# Patient Record
Sex: Male | Born: 1935 | Race: White | Hispanic: No | Marital: Married | Smoking: Former smoker
Health system: Southern US, Community
[De-identification: ages and names within clinical notes are randomized; demographics above are authoritative.]

## PROBLEM LIST (undated history)

## (undated) DIAGNOSIS — F0390 Unspecified dementia without behavioral disturbance: Secondary | ICD-10-CM

## (undated) DIAGNOSIS — K219 Gastro-esophageal reflux disease without esophagitis: Secondary | ICD-10-CM

---

## 2008-11-24 ENCOUNTER — Ambulatory Visit: Payer: Self-pay | Admitting: Gastroenterology

## 2008-12-12 ENCOUNTER — Ambulatory Visit: Payer: Self-pay | Admitting: Gastroenterology

## 2009-10-03 ENCOUNTER — Ambulatory Visit: Payer: Self-pay | Admitting: Otolaryngology

## 2009-10-30 ENCOUNTER — Ambulatory Visit: Payer: Self-pay | Admitting: Otolaryngology

## 2009-11-01 ENCOUNTER — Ambulatory Visit: Payer: Self-pay | Admitting: Otolaryngology

## 2009-11-06 LAB — PATHOLOGY REPORT

## 2012-11-22 ENCOUNTER — Ambulatory Visit: Payer: Self-pay | Admitting: Neurology

## 2014-09-13 ENCOUNTER — Other Ambulatory Visit: Payer: Self-pay | Admitting: Internal Medicine

## 2014-09-13 ENCOUNTER — Ambulatory Visit
Admission: RE | Admit: 2014-09-13 | Discharge: 2014-09-13 | Disposition: A | Discharge status: Home / Self Care | Payer: Medicare Other | Source: Ambulatory Visit | Attending: Internal Medicine | Admitting: Internal Medicine

## 2014-09-13 DIAGNOSIS — I2584 Coronary atherosclerosis due to calcified coronary lesion: Secondary | ICD-10-CM | POA: Diagnosis not present

## 2014-09-13 DIAGNOSIS — I2510 Atherosclerotic heart disease of native coronary artery without angina pectoris: Secondary | ICD-10-CM | POA: Diagnosis not present

## 2014-09-13 DIAGNOSIS — R0602 Shortness of breath: Secondary | ICD-10-CM | POA: Diagnosis present

## 2014-09-13 DIAGNOSIS — J984 Other disorders of lung: Secondary | ICD-10-CM | POA: Insufficient documentation

## 2014-09-13 MED ORDER — IOHEXOL 350 MG/ML SOLN
75.0000 mL | Freq: Once | INTRAVENOUS | Status: AC | PRN
  Administered 2014-09-13: 75 mL via INTRAVENOUS

## 2016-03-08 ENCOUNTER — Inpatient Hospital Stay
Admission: EM | Admit: 2016-03-08 | Discharge: 2016-03-12 | DRG: 379 | Disposition: A | Discharge status: Home / Self Care | Payer: Medicare Other | Attending: Internal Medicine | Admitting: Internal Medicine

## 2016-03-08 ENCOUNTER — Emergency Department: Payer: Medicare Other

## 2016-03-08 DIAGNOSIS — Z7982 Long term (current) use of aspirin: Secondary | ICD-10-CM | POA: Diagnosis not present

## 2016-03-08 DIAGNOSIS — K5641 Fecal impaction: Secondary | ICD-10-CM | POA: Diagnosis present

## 2016-03-08 DIAGNOSIS — N189 Chronic kidney disease, unspecified: Secondary | ICD-10-CM | POA: Diagnosis present

## 2016-03-08 DIAGNOSIS — Z833 Family history of diabetes mellitus: Secondary | ICD-10-CM | POA: Diagnosis not present

## 2016-03-08 DIAGNOSIS — E876 Hypokalemia: Secondary | ICD-10-CM | POA: Diagnosis present

## 2016-03-08 DIAGNOSIS — K8020 Calculus of gallbladder without cholecystitis without obstruction: Secondary | ICD-10-CM

## 2016-03-08 DIAGNOSIS — K449 Diaphragmatic hernia without obstruction or gangrene: Secondary | ICD-10-CM | POA: Diagnosis present

## 2016-03-08 DIAGNOSIS — R945 Abnormal results of liver function studies: Secondary | ICD-10-CM

## 2016-03-08 DIAGNOSIS — E1122 Type 2 diabetes mellitus with diabetic chronic kidney disease: Secondary | ICD-10-CM | POA: Diagnosis present

## 2016-03-08 DIAGNOSIS — F0280 Dementia in other diseases classified elsewhere without behavioral disturbance: Secondary | ICD-10-CM | POA: Diagnosis present

## 2016-03-08 DIAGNOSIS — I129 Hypertensive chronic kidney disease with stage 1 through stage 4 chronic kidney disease, or unspecified chronic kidney disease: Secondary | ICD-10-CM | POA: Diagnosis present

## 2016-03-08 DIAGNOSIS — K920 Hematemesis: Secondary | ICD-10-CM | POA: Diagnosis present

## 2016-03-08 DIAGNOSIS — Z66 Do not resuscitate: Secondary | ICD-10-CM | POA: Diagnosis present

## 2016-03-08 DIAGNOSIS — K219 Gastro-esophageal reflux disease without esophagitis: Secondary | ICD-10-CM | POA: Diagnosis present

## 2016-03-08 DIAGNOSIS — R7989 Other specified abnormal findings of blood chemistry: Secondary | ICD-10-CM

## 2016-03-08 DIAGNOSIS — K31811 Angiodysplasia of stomach and duodenum with bleeding: Principal | ICD-10-CM | POA: Diagnosis present

## 2016-03-08 DIAGNOSIS — K2901 Acute gastritis with bleeding: Secondary | ICD-10-CM

## 2016-03-08 DIAGNOSIS — R112 Nausea with vomiting, unspecified: Secondary | ICD-10-CM | POA: Diagnosis present

## 2016-03-08 DIAGNOSIS — R8290 Unspecified abnormal findings in urine: Secondary | ICD-10-CM | POA: Diagnosis not present

## 2016-03-08 HISTORY — DX: Gastro-esophageal reflux disease without esophagitis: K21.9

## 2016-03-08 HISTORY — DX: Unspecified dementia, unspecified severity, without behavioral disturbance, psychotic disturbance, mood disturbance, and anxiety: F03.90

## 2016-03-08 LAB — CBC WITH DIFFERENTIAL/PLATELET
Basophils Absolute: 0 K/uL (ref 0–0.1)
Basophils Relative: 0 %
Eosinophils Absolute: 0 K/uL (ref 0–0.7)
Eosinophils Relative: 0 %
HCT: 43.6 % (ref 40.0–52.0)
Hemoglobin: 15.2 g/dL (ref 13.0–18.0)
Lymphocytes Relative: 18 %
Lymphs Abs: 1.9 K/uL (ref 1.0–3.6)
MCH: 32.2 pg (ref 26.0–34.0)
MCHC: 34.8 g/dL (ref 32.0–36.0)
MCV: 92.7 fL (ref 80.0–100.0)
Monocytes Absolute: 0.7 K/uL (ref 0.2–1.0)
Monocytes Relative: 6 %
Neutro Abs: 8.3 K/uL — ABNORMAL HIGH (ref 1.4–6.5)
Neutrophils Relative %: 76 %
Platelets: 169 K/uL (ref 150–440)
RBC: 4.71 MIL/uL (ref 4.40–5.90)
RDW: 13.7 % (ref 11.5–14.5)
WBC: 10.9 K/uL — ABNORMAL HIGH (ref 3.8–10.6)

## 2016-03-08 LAB — COMPREHENSIVE METABOLIC PANEL WITH GFR
ALT: 206 U/L — ABNORMAL HIGH (ref 17–63)
AST: 221 U/L — ABNORMAL HIGH (ref 15–41)
Albumin: 3.6 g/dL (ref 3.5–5.0)
Alkaline Phosphatase: 129 U/L — ABNORMAL HIGH (ref 38–126)
Anion gap: 8 (ref 5–15)
BUN: 25 mg/dL — ABNORMAL HIGH (ref 6–20)
CO2: 25 mmol/L (ref 22–32)
Calcium: 8.8 mg/dL — ABNORMAL LOW (ref 8.9–10.3)
Chloride: 104 mmol/L (ref 101–111)
Creatinine, Ser: 0.7 mg/dL (ref 0.61–1.24)
GFR calc Af Amer: >60 mL/min
GFR calc non Af Amer: >60 mL/min
Glucose, Bld: 117 mg/dL — ABNORMAL HIGH (ref 65–99)
Potassium: 3.4 mmol/L — ABNORMAL LOW (ref 3.5–5.1)
Sodium: 137 mmol/L (ref 135–145)
Total Bilirubin: 5.1 mg/dL — ABNORMAL HIGH (ref 0.3–1.2)
Total Protein: 7.2 g/dL (ref 6.5–8.1)

## 2016-03-08 LAB — GLUCOSE, CAPILLARY
GLUCOSE-CAPILLARY: 118 mg/dL — AB (ref 65–99)
Glucose-Capillary: 116 mg/dL — ABNORMAL HIGH (ref 65–99)

## 2016-03-08 LAB — HEMOGLOBIN AND HEMATOCRIT, BLOOD
HCT: 44.1 % (ref 40.0–52.0)
Hemoglobin: 15.2 g/dL (ref 13.0–18.0)

## 2016-03-08 LAB — LIPASE, BLOOD: Lipase: 20 U/L (ref 11–51)

## 2016-03-08 LAB — TYPE AND SCREEN
ABO/RH(D): O POS
Antibody Screen: NEGATIVE

## 2016-03-08 MED ORDER — INSULIN ASPART 100 UNIT/ML ~~LOC~~ SOLN
0.0000 [IU] | Freq: Every day | SUBCUTANEOUS | Status: DC
Start: 2016-03-08 — End: 2016-03-09

## 2016-03-08 MED ORDER — SODIUM CHLORIDE 0.9 % IV SOLN
80.0000 mg | Freq: Once | INTRAVENOUS | Status: AC
  Administered 2016-03-08: 80 mg via INTRAVENOUS
  Filled 2016-03-08: qty 80

## 2016-03-08 MED ORDER — SODIUM CHLORIDE 0.9 % IV SOLN
8.0000 mg/h | INTRAVENOUS | Status: AC
  Administered 2016-03-08 – 2016-03-11 (×6): 8 mg/h via INTRAVENOUS
  Filled 2016-03-08 (×6): qty 80

## 2016-03-08 MED ORDER — ACETAMINOPHEN 650 MG RE SUPP: 650.0000 mg | Freq: Four times a day (QID) | RECTAL | Status: DC | PRN

## 2016-03-08 MED ORDER — ACETAMINOPHEN 325 MG PO TABS
650.0000 mg | ORAL_TABLET | Freq: Four times a day (QID) | ORAL | Status: DC | PRN
  Administered 2016-03-09 – 2016-03-10 (×2): 650 mg via ORAL
  Filled 2016-03-08 (×2): qty 2

## 2016-03-08 MED ORDER — ONDANSETRON HCL 4 MG PO TABS: 4.0000 mg | ORAL_TABLET | Freq: Four times a day (QID) | ORAL | Status: DC | PRN

## 2016-03-08 MED ORDER — ONDANSETRON HCL 4 MG/2ML IJ SOLN: 4.0000 mg | Freq: Four times a day (QID) | INTRAMUSCULAR | Status: DC | PRN

## 2016-03-08 MED ORDER — IOPAMIDOL (ISOVUE-300) INJECTION 61%: 15.0000 mL | INTRAVENOUS | Status: AC

## 2016-03-08 MED ORDER — MORPHINE SULFATE (PF) 4 MG/ML IV SOLN: 2.0000 mg | INTRAVENOUS | Status: DC | PRN

## 2016-03-08 MED ORDER — PANTOPRAZOLE SODIUM 40 MG IV SOLR
40.0000 mg | Freq: Two times a day (BID) | INTRAVENOUS | Status: DC
  Administered 2016-03-12: 09:00:00 40 mg via INTRAVENOUS
  Filled 2016-03-08: qty 40

## 2016-03-08 MED ORDER — HYDROMORPHONE HCL 1 MG/ML IJ SOLN
0.5000 mg | Freq: Once | INTRAMUSCULAR | Status: AC
  Administered 2016-03-08: 0.5 mg via INTRAVENOUS
  Filled 2016-03-08: qty 1

## 2016-03-08 MED ORDER — MEMANTINE HCL 10 MG PO TABS
10.0000 mg | ORAL_TABLET | Freq: Every day | ORAL | Status: DC
  Administered 2016-03-09 – 2016-03-12 (×4): 10 mg via ORAL
  Filled 2016-03-08 (×2): qty 1
  Filled 2016-03-08: qty 2
  Filled 2016-03-08 (×3): qty 1

## 2016-03-08 MED ORDER — ONDANSETRON HCL 4 MG/2ML IJ SOLN
4.0000 mg | Freq: Once | INTRAMUSCULAR | Status: AC
  Administered 2016-03-08: 4 mg via INTRAVENOUS
  Filled 2016-03-08: qty 2

## 2016-03-08 MED ORDER — POTASSIUM CHLORIDE IN NACL 20-0.9 MEQ/L-% IV SOLN
INTRAVENOUS | Status: AC
  Administered 2016-03-08: 20:00:00 via INTRAVENOUS
  Filled 2016-03-08: qty 1000

## 2016-03-08 MED ORDER — PANTOPRAZOLE SODIUM 40 MG IV SOLR
40.0000 mg | Freq: Once | INTRAVENOUS | Status: AC
  Administered 2016-03-08: 40 mg via INTRAVENOUS
  Filled 2016-03-08: qty 40

## 2016-03-08 MED ORDER — INSULIN ASPART 100 UNIT/ML ~~LOC~~ SOLN: 0.0000 [IU] | Freq: Three times a day (TID) | SUBCUTANEOUS | Status: DC

## 2016-03-08 NOTE — ED Notes (Signed)
Pt given blankets. Family at bedside.

## 2016-03-08 NOTE — H&P (Signed)
[facility] - [place] at [facility]   PATIENT NAME: Joshua Vaughn    MR#:  [id number]  DATE OF BIRTH:  [birth year]  DATE OF ADMISSION:  03/08/2016  PRIMARY CARE PHYSICIAN: Danella Penton, MD   REQUESTING/REFERRING PHYSICIAN: Huel Cote  CHIEF COMPLAINT:   Coffee-ground emesis HISTORY OF PRESENT ILLNESS:  Joshua Vaughn  is a 82 y.o. male with a known history of Dementia, GERD, diabetes mellitus and hypertension is brought in by EMS from home for coffee-ground emesis. According to the daughter patient was having epigastric abdominal pain, wife was reporting that he had similar episode one week ago which spontaneously resolved. Patient is a poor historian as he has dementia. CT abdomen has revealed fecal impaction and urinary bladder with high-density material. Hemoglobin is at 15, LFTs are elevated  PAST MEDICAL HISTORY:  Diabetes mellitus, hypertension, sickle cell disease, chronic kidney disease  PAST SURGICAL HISTOIRY:  History reviewed. No pertinent surgical history.  SOCIAL HISTORY:   Social History  Substance Use Topics  . Smoking status: Never Smoker  . Smokeless tobacco: Never Used  . Alcohol use No    FAMILY HISTORY:  Diabetes mellitus onset his family according to the family  DRUG ALLERGIES:  No Known Allergies  REVIEW OF SYSTEMS:  Review of systems unobtainable as the patient is demented  MEDICATIONS AT HOME:   Prior to Admission medications   Medication Sig Start Date End Date Taking? Authorizing Provider  aspirin EC 81 MG tablet Take by mouth.   Yes Historical Provider, MD  docusate sodium (COLACE) 100 MG capsule Take 100 mg by mouth daily.   Yes Historical Provider, MD  memantine (NAMENDA) 10 MG tablet 10 mg 2 (two) times daily.  03/02/16  Yes Historical Provider, MD      VITAL SIGNS:  Blood pressure 112/69, pulse 91, temperature 97.8 F (36.6 C), temperature source Axillary, resp. rate 18, SpO2 96 %.  PHYSICAL EXAMINATION:   GENERAL:  82 y.o.-year-old patient lying in the bed with no acute distress.  EYES: Pupils equal, round, reactive to light and accommodation. No scleral icterus.  HEENT: Head atraumatic, normocephalic. Oropharynx and nasopharynx clear.  NECK:  Supple, no jugular venous distention. No thyroid enlargement, no tenderness.  LUNGS: Normal breath sounds bilaterally, no wheezing, rales,rhonchi or crepitation. No use of accessory muscles of respiration.  CARDIOVASCULAR: S1, S2 normal. No murmurs, rubs, or gallops.  ABDOMEN: Soft, nontender, nondistended. Bowel sounds present. No organomegaly or mass.  EXTREMITIES: No pedal edema, cyanosis, or clubbing.  NEUROLOGIC: Patient is demented Gait not checked.  PSYCHIATRIC: The patient is demented SKIN: No obvious rash, lesion, or ulcer.   LABORATORY PANEL:   CBC  Recent Labs Lab 03/08/16 1420  WBC 10.9*  HGB 15.2  HCT 43.6  PLT 169   ------------------------------------------------------------------------------------------------------------------  Chemistries   Recent Labs Lab 03/08/16 1420  NA 137  K 3.4*  CL 104  CO2 25  GLUCOSE 117*  BUN 25*  CREATININE 0.70  CALCIUM 8.8*  AST 221*  ALT 206*  ALKPHOS 129*  BILITOT 5.1*   ------------------------------------------------------------------------------------------------------------------  Cardiac Enzymes No results for input(s): TROPONINI in the last 168 hours. ------------------------------------------------------------------------------------------------------------------  RADIOLOGY:  Ct Abdomen Pelvis Wo Contrast  Result Date: 03/08/2016 CLINICAL DATA:  Abdominal distension, no bowel movement for 4 days EXAM: CT ABDOMEN AND PELVIS WITHOUT CONTRAST TECHNIQUE: Multidetector CT imaging of the abdomen and pelvis was performed following the standard protocol without IV contrast. COMPARISON:  None. FINDINGS: Lower chest: Lung bases shows mild posterior  atelectasis. Extensive  atherosclerotic calcifications of coronary arteries. Borderline cardiomegaly. Small hiatal hernia. Hepatobiliary: Unenhanced liver shows no biliary ductal dilatation. Gallbladder is contracted. Small calcified gallstones are noted within gallbladder the largest measures 5 mm. Pancreas: Unenhanced pancreas is normal. Spleen: Unenhanced spleen is normal. Adrenals/Urinary Tract: No adrenal gland mass. No nephrolithiasis. No hydronephrosis or hydroureter. No calcified ureteral calculi. The urinary bladder content measures 30 Hounsfield in attenuation. This may be due to proteinaceous or hemorrhagic products. Clinical correlation is necessary. Stomach/Bowel: No small bowel obstruction. No pericecal inflammation. The terminal ileum is unremarkable. Normal appendix partially visualized in axial image 58. No evidence of acute colitis. Moderate gas noted in transverse colon. There is abundant stool within rectum. The rectum measures at least 8.8 cm in diameter consistent with fecal impaction. Moderate stool noted in distal sigmoid colon. Vascular/Lymphatic: No aortic aneurysm.  No adenopathy. Reproductive: Prostate gland and seminal vesicles are unremarkable. The urinary bladder is unremarkable. Other: No ascites or free abdominal air. Bilateral inguinal scrotal canal hernia containing fat without evidence of acute complication. Musculoskeletal: Sagittal images of the spine shows diffuse osteopenia. Mild degenerative changes thoracolumbar spine. Schmorl's node deformity noted upper endplate of L2 and L3 vertebral body. Minimal compression deformity upper endplate of L2 vertebral body. Schmorl's node deformity upper endplate of L4 vertebral body. There is disc space flattening with vacuum disc phenomenon at L5-S1 level. IMPRESSION: 1. There is abundant stool within rectum. The rectum is distended with stool measures at least 8.8 cm in diameter consistent with significant fecal impaction. Moderate stool noted in distal sigmoid  colon. 2. High-density material within urinary bladder may represent proteinaceous or hemorrhagic products. Clinical correlation is necessary. No hydronephrosis or hydroureter. No nephrolithiasis. 3. No pericecal inflammation. Normal appendix partially visualized. Moderate gas noted within transverse colon. 4. No small bowel obstruction. 5. Extensive atherosclerotic calcifications of coronary arteries. 6. Degenerative changes lumbar spine as described above. Electronically Signed   By: Natasha MeadLiviu  Pop M.D.   On: 03/08/2016 15:26    EKG:  No orders found for this or any previous visit.  IMPRESSION AND PLAN:   Joshua MeadJoe Vaughn  is a 82 y.o. male with a known history of Dementia, GERD, diabetes mellitus and hypertension is brought in by EMS from home for coffee-ground emesis. According to the daughter patient was having epigastric abdominal pain  #Upper GI bleed with coffee-ground emesis Admit to MedSurg unit, patient is hemodynamically stable at this time with hemoglobin at 15 Nothing by mouth, IV fluids, GI consult Protonix drip Monitor hemoglobin and hematocrit every 6 hours and transfuse as needed Weakly positive fecal occult blood according to the ED physician  #Transaminitis-unclear etiology Check hepatitis panel, GI consult and IV fluids  #Chronic dementia continued  Namenda  #Diabetes mellitus currently patient is nothing by mouth we will provide sliding scale insulin  #Urinary bladder with dense proteinaceous versus hemorrhagic products Monitor hemoglobin and hematocrit and urology consult is placed    All the records are reviewed and case discussed with ED provider. Management plans discussed with the patient, family and they are in agreement.  CODE STATUS: DO NOT RESUSCITATE, wife healthcare power of attorney  TOTAL TIME TAKING CARE OF THIS PATIENT: 45 minutes.   Note: This dictation was prepared with Dragon dictation along with smaller phrase technology. Any transcriptional errors  that result from this process are unintentional.  Ramonita LabGouru, Akio Hudnall M.D on 03/08/2016 at 4:48 PM  Between 7am to 6pm - Pager - [phone]  After 6pm go to [email or web address] -  password [email or web address] [facility]  [facility]  Office  [phone]  CC: Primary care physician; Danella PentonMark F Miller, MD

## 2016-03-08 NOTE — Progress Notes (Signed)
Dr. Mena GoesEskridge notified about Urology consult. Verbal order to bladder scan patient, if greater than 300ml, place 1816fr foley catheter, and send urinalysis. [facility] who will be taking over patient care.

## 2016-03-08 NOTE — ED Provider Notes (Addendum)
Time Seen: Approximately *1450  I have reviewed the triage notes  Chief Complaint: Emesis   History of Present Illness: Joshua Vaughn is a 82 y.o. male *who presents via EMS from home with vomiting. EMS providers shows me a picture of the emesis which is charcoal in appearance with coffee ground emesis. Red blood was seen. Patient was transported here uneventfully and I have not had a chance to interview his family. The patient has a known history of dementia is unable to offer any history or review of systems. EMS notes the patient had some diffuse abdominal pain.   History reviewed. No pertinent past medical history.  There are no active problems to display for this patient.   History reviewed. No pertinent surgical history.  History reviewed. No pertinent surgical history.    Allergies:  Patient has no known allergies.  Family History: No family history on file.  Social History: Social History  Substance Use Topics  . Smoking status: Never Smoker  . Smokeless tobacco: Never Used  . Alcohol use No     Review of Systems:   10 point review of systems was performed and was otherwise negative: Review of systems through EMS and medical record Constitutional: No fever Eyes: No visual disturbances ENT: No sore throat, ear pain Cardiac: No chest pain Respiratory: No shortness of breath, wheezing, or stridor Abdomen: Diffuse abdominal pain with a one time episode of emesis at home. No diarrhea Endocrine: No weight loss, No night sweats Extremities: No peripheral edema, cyanosis Skin: No rashes, easy bruising Neurologic: No focal weakness, trouble with speech or swollowing Urologic: No dysuria, Hematuria, or urinary frequency *  Physical Exam:  ED Triage Vitals [03/08/16 1419]  Enc Vitals Group     BP (!) 141/76     Pulse Rate 100     Resp 18     Temp 97.8 F (36.6 C)     Temp Source Axillary     SpO2 94 %     Weight      Height      Head Circumference       Peak Flow      Pain Score      Pain Loc      Pain Edu?      Excl. in GC?     General: Awake , Alert , and Oriented times1 and a phasic occasionally follows commands Head: Normal cephalic , atraumatic Eyes: Pupils equal , round, reactive to light Nose/Throat: No nasal drainage, patent upper airway without erythema or exudate. Dry mucous membranes Neck: Supple, Full range of motion, No anterior adenopathy or palpable thyroid masses Lungs: Clear to ascultation without wheezes , rhonchi, or rales Heart: Regular rate, regular rhythm without murmurs , gallops , or rubs Abdomen: Tender diffusely with guarding on exam. No palpable masses are noted    Extremities: 2 plus symmetric pulses. No edema, clubbing or cyanosis Neurologic:, Motor symmetric without deficits, sensory intact Skin: warm, dry, no rashes Rectal exam was guaiac negative with normal sphincter tone and large amount of hard stool in the rectal vault. No palpable masses  Labs:   All laboratory work was reviewed including any pertinent negatives or positives listed below:  Labs Reviewed  COMPREHENSIVE METABOLIC PANEL - Abnormal; Notable for the following:       Result Value   Potassium 3.4 (*)    Glucose, Bld 117 (*)    BUN 25 (*)    Calcium 8.8 (*)    AST  221 (*)    ALT 206 (*)    Alkaline Phosphatase 129 (*)    Total Bilirubin 5.1 (*)    All other components within normal limits  CBC WITH DIFFERENTIAL/PLATELET - Abnormal; Notable for the following:    WBC 10.9 (*)    Neutro Abs 8.3 (*)    All other components within normal limits  LIPASE, BLOOD  TYPE AND SCREEN    Radiology:  "Ct Abdomen Pelvis Wo Contrast  Result Date: 03/08/2016 CLINICAL DATA:  Abdominal distension, no bowel movement for 4 days EXAM: CT ABDOMEN AND PELVIS WITHOUT CONTRAST TECHNIQUE: Multidetector CT imaging of the abdomen and pelvis was performed following the standard protocol without IV contrast. COMPARISON:  None. FINDINGS: Lower chest:  Lung bases shows mild posterior atelectasis. Extensive atherosclerotic calcifications of coronary arteries. Borderline cardiomegaly. Small hiatal hernia. Hepatobiliary: Unenhanced liver shows no biliary ductal dilatation. Gallbladder is contracted. Small calcified gallstones are noted within gallbladder the largest measures 5 mm. Pancreas: Unenhanced pancreas is normal. Spleen: Unenhanced spleen is normal. Adrenals/Urinary Tract: No adrenal gland mass. No nephrolithiasis. No hydronephrosis or hydroureter. No calcified ureteral calculi. The urinary bladder content measures 30 Hounsfield in attenuation. This may be due to proteinaceous or hemorrhagic products. Clinical correlation is necessary. Stomach/Bowel: No small bowel obstruction. No pericecal inflammation. The terminal ileum is unremarkable. Normal appendix partially visualized in axial image 58. No evidence of acute colitis. Moderate gas noted in transverse colon. There is abundant stool within rectum. The rectum measures at least 8.8 cm in diameter consistent with fecal impaction. Moderate stool noted in distal sigmoid colon. Vascular/Lymphatic: No aortic aneurysm.  No adenopathy. Reproductive: Prostate gland and seminal vesicles are unremarkable. The urinary bladder is unremarkable. Other: No ascites or free abdominal air. Bilateral inguinal scrotal canal hernia containing fat without evidence of acute complication. Musculoskeletal: Sagittal images of the spine shows diffuse osteopenia. Mild degenerative changes thoracolumbar spine. Schmorl's node deformity noted upper endplate of L2 and L3 vertebral body. Minimal compression deformity upper endplate of L2 vertebral body. Schmorl's node deformity upper endplate of L4 vertebral body. There is disc space flattening with vacuum disc phenomenon at L5-S1 level. IMPRESSION: 1. There is abundant stool within rectum. The rectum is distended with stool measures at least 8.8 cm in diameter consistent with significant  fecal impaction. Moderate stool noted in distal sigmoid colon. 2. High-density material within urinary bladder may represent proteinaceous or hemorrhagic products. Clinical correlation is necessary. No hydronephrosis or hydroureter. No nephrolithiasis. 3. No pericecal inflammation. Normal appendix partially visualized. Moderate gas noted within transverse colon. 4. No small bowel obstruction. 5. Extensive atherosclerotic calcifications of coronary arteries. 6. Degenerative changes lumbar spine as described above. Electronically Signed   By: Natasha MeadLiviu  Pop M.D.   On: 03/08/2016 15:26  "    ED Course: * Patient was worked up initially for upper GI bleed with type and screen. 2 IVs were established and the patient was given a normal saline bolus. His exam showed tenderness across the abdominal region. Possible bowel perforation, etc. Patient was given IV pain control and at this time remains hemodynamically stable. Patient was initiated on proton pump inhibitor therapy. Clinical Course      Assessment:  Acute upper gastrointestinal bleed Fecal impaction     Plan:  Plan is to follow results of the CAT scan and disposition the patient appropriately to either surgery or general medicine.            Jennye MoccasinBrian S Prescilla Monger, MD 03/08/16 [phone]  Jennye MoccasinBrian S Kazandra Forstrom, MD 03/08/16 [phone]

## 2016-03-08 NOTE — Progress Notes (Signed)
Dr. Amado CoeGouru notified patient is full code in the computer but wife at bedside verified patient is a DNR CODE STATUS. Dr. Amado CoeGouru to change order.

## 2016-03-08 NOTE — ED Notes (Signed)
Soap suds enema given. Pt cleaned and new sheets and brief applied.

## 2016-03-08 NOTE — Progress Notes (Signed)
Wife at bedside states patient is non-ambulatory at baseline- uses lift from bed to recliner at home.

## 2016-03-08 NOTE — ED Triage Notes (Signed)
Pt came to ED via EMS from home c/o nausea, vomiting, abdominal pain starting today. Pt has dementia, oriented x1. Per wife, pt was throwing up substantial amount of blood.

## 2016-03-08 NOTE — ED Notes (Signed)
Pt placed on 2L [place]

## 2016-03-09 ENCOUNTER — Inpatient Hospital Stay: Payer: Medicare Other

## 2016-03-09 DIAGNOSIS — R7989 Other specified abnormal findings of blood chemistry: Secondary | ICD-10-CM

## 2016-03-09 DIAGNOSIS — R8290 Unspecified abnormal findings in urine: Secondary | ICD-10-CM

## 2016-03-09 LAB — URINALYSIS COMPLETE WITH MICROSCOPIC (ARMC ONLY)
BILIRUBIN URINE: NEGATIVE
Bacteria, UA: NONE SEEN
Glucose, UA: NEGATIVE mg/dL
Hgb urine dipstick: NEGATIVE
KETONES UR: NEGATIVE mg/dL
Leukocytes, UA: NEGATIVE
Nitrite: NEGATIVE
PH: 5 (ref 5.0–8.0)
Protein, ur: NEGATIVE mg/dL
RBC / HPF: NONE SEEN RBC/hpf (ref 0–5)
SQUAMOUS EPITHELIAL / LPF: NONE SEEN
Specific Gravity, Urine: 1.024 (ref 1.005–1.030)

## 2016-03-09 LAB — CBC
HCT: 38.1 % — ABNORMAL LOW (ref 40.0–52.0)
Hemoglobin: 13.3 g/dL (ref 13.0–18.0)
MCH: 32.1 pg (ref 26.0–34.0)
MCHC: 34.9 g/dL (ref 32.0–36.0)
MCV: 92.1 fL (ref 80.0–100.0)
PLATELETS: 158 10*3/uL (ref 150–440)
RBC: 4.13 MIL/uL — AB (ref 4.40–5.90)
RDW: 13.8 % (ref 11.5–14.5)
WBC: 8.4 10*3/uL (ref 3.8–10.6)

## 2016-03-09 LAB — COMPREHENSIVE METABOLIC PANEL
ALT: 186 U/L — AB (ref 17–63)
ANION GAP: 5 (ref 5–15)
AST: 161 U/L — ABNORMAL HIGH (ref 15–41)
Albumin: 3.1 g/dL — ABNORMAL LOW (ref 3.5–5.0)
Alkaline Phosphatase: 113 U/L (ref 38–126)
BUN: 27 mg/dL — ABNORMAL HIGH (ref 6–20)
CHLORIDE: 110 mmol/L (ref 101–111)
CO2: 23 mmol/L (ref 22–32)
Calcium: 8.4 mg/dL — ABNORMAL LOW (ref 8.9–10.3)
Creatinine, Ser: 0.74 mg/dL (ref 0.61–1.24)
Glucose, Bld: 97 mg/dL (ref 65–99)
POTASSIUM: 3.7 mmol/L (ref 3.5–5.1)
SODIUM: 138 mmol/L (ref 135–145)
Total Bilirubin: 5 mg/dL — ABNORMAL HIGH (ref 0.3–1.2)
Total Protein: 6.2 g/dL — ABNORMAL LOW (ref 6.5–8.1)

## 2016-03-09 LAB — PROTIME-INR
INR: 1.17
PROTHROMBIN TIME: 15 s (ref 11.4–15.2)

## 2016-03-09 LAB — HEMOGLOBIN AND HEMATOCRIT, BLOOD
HEMATOCRIT: 39.5 % — AB (ref 40.0–52.0)
HEMOGLOBIN: 13.9 g/dL (ref 13.0–18.0)

## 2016-03-09 LAB — GLUCOSE, CAPILLARY
GLUCOSE-CAPILLARY: 90 mg/dL (ref 65–99)
Glucose-Capillary: 85 mg/dL (ref 65–99)

## 2016-03-09 MED ORDER — ORAL CARE MOUTH RINSE
15.0000 mL | Freq: Two times a day (BID) | OROMUCOSAL | Status: DC
  Administered 2016-03-09: 15 mL via OROMUCOSAL

## 2016-03-09 NOTE — Plan of Care (Signed)
Problem: Education: Goal: Knowledge of [place] [facility] Education information/materials will improve Outcome: Progressing Pt likes to be called Joshua Vaughn  history of Dementia, GERD, diabetes mellitus and hypertension   Pt is well controlled with home medications

## 2016-03-09 NOTE — Progress Notes (Signed)
[facility] - [place] at [facility]   PATIENT NAME: Joshua MeadJoe Vaughn    MR#:  [id number]  DATE OF BIRTH:  03/10/1934  SUBJECTIVE:   Patient here due to coffee ground emesis. Hemoglobin stable. No further vomiting this morning. CT scan on admission was suggestive of fecal action but much improved with an enema. Family at bedside.  REVIEW OF SYSTEMS:    Review of Systems  Unable to perform ROS: Dementia    Nutrition: NPO Tolerating Diet: No await GI eval.  Tolerating PT: Await Eval.    DRUG ALLERGIES:  No Known Allergies  VITALS:  Blood pressure 124/77, pulse 88, temperature 97.4 F (36.3 C), temperature source Oral, resp. rate 18, weight 78.1 kg (172 lb 3.2 oz), SpO2 94 %.  PHYSICAL EXAMINATION:   Physical Exam  GENERAL:  82 y.o.-year-old demented patient lying in the bed in no acute distress.  EYES: Pupils equal, round, reactive to light and accommodation. No scleral icterus. Extraocular muscles intact.  HEENT: Head atraumatic, normocephalic. Oropharynx and nasopharynx clear.  NECK:  Supple, no jugular venous distention. No thyroid enlargement, no tenderness.  LUNGS: Normal breath sounds bilaterally, no wheezing, rales, rhonchi. No use of accessory muscles of respiration.  CARDIOVASCULAR: S1, S2 normal. No murmurs, rubs, or gallops.  ABDOMEN: Soft, nontender, nondistended. Bowel sounds present. No organomegaly or mass.  EXTREMITIES: No cyanosis, clubbing or edema b/l.    NEUROLOGIC: Cranial nerves II through XII are intact. No focal Motor or sensory deficits b/l.  Globally weak PSYCHIATRIC: The patient is alert and oriented x 1. Dementia SKIN: No obvious rash, lesion, or ulcer.    LABORATORY PANEL:   CBC  Recent Labs Lab 03/09/16 0621  WBC 8.4  HGB 13.3  HCT 38.1*  PLT 158   ------------------------------------------------------------------------------------------------------------------  Chemistries   Recent Labs Lab 03/09/16 0621  NA 138   K 3.7  CL 110  CO2 23  GLUCOSE 97  BUN 27*  CREATININE 0.74  CALCIUM 8.4*  AST 161*  ALT 186*  ALKPHOS 113  BILITOT 5.0*   ------------------------------------------------------------------------------------------------------------------  Cardiac Enzymes No results for input(s): TROPONINI in the last 168 hours. ------------------------------------------------------------------------------------------------------------------  RADIOLOGY:  Ct Abdomen Pelvis Wo Contrast  Result Date: 03/08/2016 CLINICAL DATA:  Abdominal distension, no bowel movement for 4 days EXAM: CT ABDOMEN AND PELVIS WITHOUT CONTRAST TECHNIQUE: Multidetector CT imaging of the abdomen and pelvis was performed following the standard protocol without IV contrast. COMPARISON:  None. FINDINGS: Lower chest: Lung bases shows mild posterior atelectasis. Extensive atherosclerotic calcifications of coronary arteries. Borderline cardiomegaly. Small hiatal hernia. Hepatobiliary: Unenhanced liver shows no biliary ductal dilatation. Gallbladder is contracted. Small calcified gallstones are noted within gallbladder the largest measures 5 mm. Pancreas: Unenhanced pancreas is normal. Spleen: Unenhanced spleen is normal. Adrenals/Urinary Tract: No adrenal gland mass. No nephrolithiasis. No hydronephrosis or hydroureter. No calcified ureteral calculi. The urinary bladder content measures 30 Hounsfield in attenuation. This may be due to proteinaceous or hemorrhagic products. Clinical correlation is necessary. Stomach/Bowel: No small bowel obstruction. No pericecal inflammation. The terminal ileum is unremarkable. Normal appendix partially visualized in axial image 58. No evidence of acute colitis. Moderate gas noted in transverse colon. There is abundant stool within rectum. The rectum measures at least 8.8 cm in diameter consistent with fecal impaction. Moderate stool noted in distal sigmoid colon. Vascular/Lymphatic: No aortic aneurysm.  No  adenopathy. Reproductive: Prostate gland and seminal vesicles are unremarkable. The urinary bladder is unremarkable. Other: No ascites or free abdominal air. Bilateral inguinal scrotal  canal hernia containing fat without evidence of acute complication. Musculoskeletal: Sagittal images of the spine shows diffuse osteopenia. Mild degenerative changes thoracolumbar spine. Schmorl's node deformity noted upper endplate of L2 and L3 vertebral body. Minimal compression deformity upper endplate of L2 vertebral body. Schmorl's node deformity upper endplate of L4 vertebral body. There is disc space flattening with vacuum disc phenomenon at L5-S1 level. IMPRESSION: 1. There is abundant stool within rectum. The rectum is distended with stool measures at least 8.8 cm in diameter consistent with significant fecal impaction. Moderate stool noted in distal sigmoid colon. 2. High-density material within urinary bladder may represent proteinaceous or hemorrhagic products. Clinical correlation is necessary. No hydronephrosis or hydroureter. No nephrolithiasis. 3. No pericecal inflammation. Normal appendix partially visualized. Moderate gas noted within transverse colon. 4. No small bowel obstruction. 5. Extensive atherosclerotic calcifications of coronary arteries. 6. Degenerative changes lumbar spine as described above. Electronically Signed   By: Natasha MeadLiviu  Pop M.D.   On: 03/08/2016 15:26     ASSESSMENT AND PLAN:   82 year old male with past medical history of Alzheimer's dementia, COPD, GERD who presented to the hospital due to coffee-ground emesis.  1. Upper GI bleed-suspected diagnosis given the patient's coffee-ground emesis. -Hemoglobin currently stable, no further vomiting overnight. Continue Protonix, await further gastroenterology input  2. Abnormal LFTs-etiology unclear. CT abdomen pelvis showing no evidence of biliary pathology. -No acute abdominal pain. No clinical evidence of pancreatitis. -Follow LFTs. Consider  a limited abdominal ultrasound. Await further gastroenterology input  3. Abnormal CT scan-patient's CT scan abdomen and pelvis showed proteinaceous and hemorrhagic products in the urinary bladder. -seen by Urology and no evidence of UTI or urinary retention. They have signed off.    4. Dementia - cont. Namenda.   5. Fecal Impaction - improved w/ enema in ER and will monitor.    All the records are reviewed and case discussed with Care Management/Social Worker. Management plans discussed with the patient, family and they are in agreement.  CODE STATUS: DNR  DVT Prophylaxis: Ted's & SCD's.   TOTAL TIME TAKING CARE OF THIS PATIENT: 30 minutes.   POSSIBLE D/C IN 1-2 DAYS, DEPENDING ON CLINICAL CONDITION.   Houston SirenSAINANI,VIVEK J M.D on 03/09/2016 at 12:01 PM  Between 7am to 6pm - Pager - [phone]  After 6pm go to [email or web address] - Social research officer, governmentpassword EPAS [email or web address]  [facility] [place] [facility]  Office  [phone]  CC: Primary care physician; Danella PentonMark F Miller, MD

## 2016-03-09 NOTE — Consult Note (Signed)
GI Inpatient Consult Note  Reason for Consult: Hematemesis, transaminitis   Attending Requesting Consult: Dr. Margaretmary Eddy  History of Present Illness: Joshua Vaughn is a 82 y.o. male with a known history of DM type II, HTN, CKD, and dementia admitted with coffee-ground emesis.  Patient's daughter provides 100% of the history due to patient's dementia, but states her mother is the primary caregiver who has provided her with the historical information.  Unfortunately, patient's wife is not present during our visit.  Patient's daughter patient began experiencing epigastric pain followed by nausea and vomiting "coffee ground emesis" yesterday morning around 5:30 am.  He vomited "a few times", but seemed to feel better thereafter.  Patient's wife cleaned him up, but he began vomiting again.  Patient also had not had a BM in about 4 days; he has a h/o chronic constipation typically relived by a stool softener daily.  Patient's daughter mentions he does not drink much water and does not eat much dietary fiber, in addition to low activity due to dementia. She does not know of instances of blood in the stool, but is unsure.  She also notes a prior episode of megastric pain with possible vomiting that resolved spontaneously shortly after onset within the last few weeks.  Patient takes "1 tablet Ibuprofen twice per day" for back pain and "1 tablet aspirin per day", though his daughter does not know the dosages.  He used to take Protonix daily, but this was discontinued about 1 week ago per PCP suggestion.  Patient's daughter recalls patient having a colonoscopies in the past, stating that his BP dropped with anesthesia during the last procedure.  The scope was not completed because of this, and rescheduled "with different anesthesia".  She denies a h/o ulcers, gastritis, or prior EGD.  Patient's daughter also denies a known history of liver disease.  Upon arrival, VSS.  Labs demonstrated stable Hgb (15.2) with elevated  LFTs (AST 221, ALT 206, alk phos 129, T bili 5.1).  Lipase WNL.  HCV RNA and HBV surface ag pending.  CT a/p w/o contrast was notable for a contracted gallbladder with small calcified stones (largest 70m) and abundant stool w/in the rectum and distal sigmoid colon noted.  FOBT was weakly positive per ED physician.  Patient was admitted for further evaluation and management, including IV fluids, IV Protonix, and GI consultation.  He also received an enema, which "was very successful" per patient's daughter.  This morning, Hgb remains stable (13.3) and LFTs improved (AST 161, ALT 186, alk phos WNL, T bili 5.0).  Past Medical History:  Diabetes mellitus, HTN, CKD, dementia  Problem List: Patient Active Problem List   Diagnosis Date Noted  . Coffee ground emesis 03/08/2016    Past Surgical History: History reviewed. No pertinent surgical history.   Allergies: No Known Allergies  Home Medications: Prescriptions Prior to Admission  Medication Sig Dispense Refill Last Dose  . aspirin EC 81 MG tablet Take by mouth.   03/07/2016 at 0800  . docusate sodium (COLACE) 100 MG capsule Take 100 mg by mouth daily.   03/07/2016 at 0800  . memantine (NAMENDA) 10 MG tablet 10 mg 2 (two) times daily.    03/07/2016 at 0800   Home medication reconciliation was completed with the patient.   Scheduled Inpatient Medications:   . insulin aspart  0-5 Units Subcutaneous QHS  . insulin aspart  0-9 Units Subcutaneous TID WC  . mouth rinse  15 mL Mouth Rinse BID  . memantine  10 mg Oral Daily  . [START ON 03/12/2016] pantoprazole  40 mg Intravenous Q12H    Continuous Inpatient Infusions:   . pantoprozole (PROTONIX) infusion 8 mg/hr (03/09/16 0456)    PRN Inpatient Medications:  acetaminophen **OR** acetaminophen, morphine injection, ondansetron **OR** ondansetron (ZOFRAN) IV  Family History: family history is not on file.   Social History:   reports that he has never smoked. He has never used  smokeless tobacco. He reports that he does not drink alcohol.  Review of Systems: Unable to perform due to dementia.   Physical Examination: BP 124/77 (BP Location: Left Arm)   Pulse 88   Temp 97.4 F (36.3 C) (Oral)   Resp 18   Wt 78.1 kg (172 lb 3.2 oz)   SpO2 94%  Gen: NAD, alert and oriented x 4, severe dementia; daughter at bedside HEENT: PEERLA, EOMI, Neck: supple, no JVD or thyromegaly Chest: CTA bilaterally, no wheezes, crackles, or other adventitious sounds CV: RRR, no m/g/c/r Abd: soft, NT, ND, +BS in all four quadrants; no HSM, guarding, ridigity, or rebound tenderness Ext: no edema, well perfused with 2+ pulses, Skin: no rash or lesions noted Lymph: no LAD  Data: Lab Results  Component Value Date   WBC 8.4 03/09/2016   HGB 13.3 03/09/2016   HCT 38.1 (L) 03/09/2016   MCV 92.1 03/09/2016   PLT 158 03/09/2016    Recent Labs Lab 03/08/16 1844 03/09/16 0108 03/09/16 0621  HGB 15.2 13.9 13.3   Lab Results  Component Value Date   NA 138 03/09/2016   K 3.7 03/09/2016   CL 110 03/09/2016   CO2 23 03/09/2016   BUN 27 (H) 03/09/2016   CREATININE 0.74 03/09/2016   Lab Results  Component Value Date   ALT 186 (H) 03/09/2016   AST 161 (H) 03/09/2016   ALKPHOS 113 03/09/2016   BILITOT 5.0 (H) 03/09/2016    Recent Labs Lab 03/09/16 0621  INR 1.17   Assessment/Plan: [name] is a 82 y.o. male with a known history of DM type II, HTN, CKD, and dementia admitted with epigastric pain and coffee-ground emesis. FOBT weakly positive in ED.  Hgb remains stable (15.2 > 13.3).  LFTs remain 3-4x upper limit of normal, with HCV and HBV ag pending.  CT a/p w/o contrast was negative for acute GI findings, but a contracted gallbladder with small calcified stones (largest 73m) was noted.  CGE likely due to ulcers or gastritis, as patient takes Ibuprofen BID and recently d/c PPI.  Per Dr. EVira Agar plan for EGD Tuesday.  Regarding elevated LFTs, will continue to monitor HFP  and further evaluate for gallstones with an [place].   Recommendations: - EKG today - Repeat HFP tomorrow - [place] - EGD Tuesday per Dr. EVira Agar- Begin clear liquid diet, nothing carbonated until midnight - then NPO for [place]- Monitor Hgb, transfuse if <7 - Continue IV Protonix 470mq 12 hours - Advised patient's daughter to try Tylenol arthritis for back pain, d/c Ibuprofen - Will need to resume PPI as outpatient - Encouraged patient's daughter to increase soluble fiber intake, in addition to stool softeners, to relieve constipation - Further recs pending patient's progress  Thank you for the consult. We will follow along with you. Please call with questions or concerns.  MiLavera GuisePA-C [facility] Phone: [phone]: [phone]

## 2016-03-09 NOTE — Progress Notes (Signed)
Patient noted with soft B/p and asymptomatic. Dr. Amado CoeGouru updated and plan to continue to monitor at this time.

## 2016-03-09 NOTE — Consult Note (Signed)
Consult: Unspecified abnormal findings in urine. R82.90  Requested by: Dr. Amado Coe  History of Present Illness: Pt admitted with elevated LFT's and fecal impaction. GU consulted for "proteinaceous or hemorrhagic products" in bladder on CT due to high HU of urine. Otherwise GU tract, prostate normal. Pt is non-ambulatory due to dementia and voids in a diaper. He has no voiding issues. Urine in diaper has had no blood. He does not complain if dysuria or bladder pain.   Per daughter, no GU hx.   History reviewed. No pertinent past medical history. History reviewed. No pertinent surgical history.  Home Medications:  Prescriptions Prior to Admission  Medication Sig Dispense Refill Last Dose  . aspirin EC 81 MG tablet Take by mouth.   03/07/2016 at 0800  . docusate sodium (COLACE) 100 MG capsule Take 100 mg by mouth daily.   03/07/2016 at 0800  . memantine (NAMENDA) 10 MG tablet 10 mg 2 (two) times daily.    03/07/2016 at 0800   Allergies: No Known Allergies  History reviewed. No pertinent family history. Social History:  reports that he has never smoked. He has never used smokeless tobacco. He reports that he does not drink alcohol. His drug history is not on file.  ROS: A complete review of systems was performed.  All systems are negative except for pertinent findings as noted. Review of Systems  Unable to perform ROS: Dementia     Physical Exam:  Vital signs in last 24 hours: Temp:  [97.4 F (36.3 C)-98.1 F (36.7 C)] 97.4 F (36.3 C) (11/26 0402) Pulse Rate:  [85-100] 88 (11/26 0402) Resp:  [12-18] 18 (11/26 0402) BP: (96-141)/(56-77) 124/77 (11/26 0402) SpO2:  [94 %-97 %] 94 % (11/26 0402) Weight:  [78.1 kg (172 lb 3.2 oz)] 78.1 kg (172 lb 3.2 oz) (11/25 1843) General:  Alert, No acute distress HEENT: Normocephalic, atraumatic Cardiovascular: Regular rate and rhythm Lungs: Regular rate and effort Abdomen: Soft, nontender, nondistended, no abdominal masses; bladder not  distended  Back: No CVA tenderness Extremities: No edema Neurologic: Grossly intact GU: penis circumcised and without mass or lesion. Scrotum appears normal. I couldn't get him to roll over for DRE.   Random Bladder scan reading 130-220 ml.    I discussed with his daughter applying a condom cath to collect a UA or an I/o cath and she consented to I/o cath. With nurses help, pt was prepped in usual sterile fashion. A red rubber catheter was advanced without difficulty. 150 ml of clear urine was drained. Urine was tea colored - likely from LFT's. No cloudiness or clots.   Laboratory Data:  Results for orders placed or performed during the hospital encounter of 03/08/16 (from the past 24 hour(s))  Type and screen     Status: None   Collection Time: 03/08/16  2:20 PM  Result Value Ref Range   ABO/RH(D) O POS    Antibody Screen NEG    Sample Expiration 03/11/2016   Comprehensive metabolic panel     Status: Abnormal   Collection Time: 03/08/16  2:20 PM  Result Value Ref Range   Sodium 137 135 - 145 mmol/L   Potassium 3.4 (L) 3.5 - 5.1 mmol/L   Chloride 104 101 - 111 mmol/L   CO2 25 22 - 32 mmol/L   Glucose, Bld 117 (H) 65 - 99 mg/dL   BUN 25 (H) 6 - 20 mg/dL   Creatinine, Ser 1.61 0.61 - 1.24 mg/dL   Calcium 8.8 (L) 8.9 - 10.3 mg/dL  Total Protein 7.2 6.5 - 8.1 g/dL   Albumin 3.6 3.5 - 5.0 g/dL   AST 161221 (H) 15 - 41 U/L   ALT 206 (H) 17 - 63 U/L   Alkaline Phosphatase 129 (H) 38 - 126 U/L   Total Bilirubin 5.1 (H) 0.3 - 1.2 mg/dL   GFR calc non Af Amer >60 >60 mL/min   GFR calc Af Amer >60 >60 mL/min   Anion gap 8 5 - 15  CBC with Differential/Platelet     Status: Abnormal   Collection Time: 03/08/16  2:20 PM  Result Value Ref Range   WBC 10.9 (H) 3.8 - 10.6 K/uL   RBC 4.71 4.40 - 5.90 MIL/uL   Hemoglobin 15.2 13.0 - 18.0 g/dL   HCT 09.643.6 04.540.0 - 40.952.0 %   MCV 92.7 80.0 - 100.0 fL   MCH 32.2 26.0 - 34.0 pg   MCHC 34.8 32.0 - 36.0 g/dL   RDW 81.113.7 91.411.5 - 78.214.5 %   Platelets 169  150 - 440 K/uL   Neutrophils Relative % 76 %   Neutro Abs 8.3 (H) 1.4 - 6.5 K/uL   Lymphocytes Relative 18 %   Lymphs Abs 1.9 1.0 - 3.6 K/uL   Monocytes Relative 6 %   Monocytes Absolute 0.7 0.2 - 1.0 K/uL   Eosinophils Relative 0 %   Eosinophils Absolute 0.0 0 - 0.7 K/uL   Basophils Relative 0 %   Basophils Absolute 0.0 0 - 0.1 K/uL  Lipase, blood     Status: None   Collection Time: 03/08/16  2:20 PM  Result Value Ref Range   Lipase 20 11 - 51 U/L  Glucose, capillary     Status: Abnormal   Collection Time: 03/08/16  5:21 PM  Result Value Ref Range   Glucose-Capillary 118 (H) 65 - 99 mg/dL  Hemoglobin and hematocrit, blood     Status: None   Collection Time: 03/08/16  6:44 PM  Result Value Ref Range   Hemoglobin 15.2 13.0 - 18.0 g/dL   HCT 95.644.1 21.340.0 - 08.652.0 %  Glucose, capillary     Status: Abnormal   Collection Time: 03/08/16  9:25 PM  Result Value Ref Range   Glucose-Capillary 116 (H) 65 - 99 mg/dL  Hemoglobin and hematocrit, blood     Status: Abnormal   Collection Time: 03/09/16  1:08 AM  Result Value Ref Range   Hemoglobin 13.9 13.0 - 18.0 g/dL   HCT 57.839.5 (L) 46.940.0 - 62.952.0 %  CBC     Status: Abnormal   Collection Time: 03/09/16  6:21 AM  Result Value Ref Range   WBC 8.4 3.8 - 10.6 K/uL   RBC 4.13 (L) 4.40 - 5.90 MIL/uL   Hemoglobin 13.3 13.0 - 18.0 g/dL   HCT 52.838.1 (L) 41.340.0 - 24.452.0 %   MCV 92.1 80.0 - 100.0 fL   MCH 32.1 26.0 - 34.0 pg   MCHC 34.9 32.0 - 36.0 g/dL   RDW 01.013.8 27.211.5 - 53.614.5 %   Platelets 158 150 - 440 K/uL  Comprehensive metabolic panel     Status: Abnormal   Collection Time: 03/09/16  6:21 AM  Result Value Ref Range   Sodium 138 135 - 145 mmol/L   Potassium 3.7 3.5 - 5.1 mmol/L   Chloride 110 101 - 111 mmol/L   CO2 23 22 - 32 mmol/L   Glucose, Bld 97 65 - 99 mg/dL   BUN 27 (H) 6 - 20 mg/dL   Creatinine, Ser 6.440.74  0.61 - 1.24 mg/dL   Calcium 8.4 (L) 8.9 - 10.3 mg/dL   Total Protein 6.2 (L) 6.5 - 8.1 g/dL   Albumin 3.1 (L) 3.5 - 5.0 g/dL   AST 161161  (H) 15 - 41 U/L   ALT 186 (H) 17 - 63 U/L   Alkaline Phosphatase 113 38 - 126 U/L   Total Bilirubin 5.0 (H) 0.3 - 1.2 mg/dL   GFR calc non Af Amer >60 >60 mL/min   GFR calc Af Amer >60 >60 mL/min   Anion gap 5 5 - 15  Protime-INR     Status: None   Collection Time: 03/09/16  6:21 AM  Result Value Ref Range   Prothrombin Time 15.0 11.4 - 15.2 seconds   INR 1.17   Glucose, capillary     Status: None   Collection Time: 03/09/16  7:29 AM  Result Value Ref Range   Glucose-Capillary 90 65 - 99 mg/dL   No results found for this or any previous visit (from the past 240 hour(s)). Creatinine:  Recent Labs  03/08/16 1420 03/09/16 0621  CREATININE 0.70 0.74    Impression/Assessment: Unspecified abnormal findings in urine. R82.90    Plan:  U/A clear. Pt not in retention and renal fxn normal. Will sign off.   Jonnae Fonseca 03/09/2016, 10:31 AM

## 2016-03-09 NOTE — Progress Notes (Signed)
Dr Mena GoesEskridge did an in and out to collect urine for urinalysis and urine culture.  Urine being sent to lab

## 2016-03-09 NOTE — Progress Notes (Signed)
Daughter asking questions about EGD.  Dr Mechele CollinElliott notified.  Pt will be npo after midnight for possible EGD tomorrow 03/10/16.  Pt to remain on clear liquids with NO carbonation then npo after midnight

## 2016-03-09 NOTE — Progress Notes (Signed)
Family asking why the patient is having his blood sugar checked because the patient is not diabetic.  Dr Cherlynn KaiserSainani notified and said to cancel CBG's

## 2016-03-10 ENCOUNTER — Encounter: Payer: Self-pay | Admitting: *Deleted

## 2016-03-10 ENCOUNTER — Inpatient Hospital Stay: Payer: Medicare Other | Admitting: Anesthesiology

## 2016-03-10 ENCOUNTER — Encounter
Admission: EM | Disposition: A | Discharge status: Home / Self Care | Payer: Self-pay | Source: Home / Self Care | Attending: Internal Medicine

## 2016-03-10 HISTORY — PX: ESOPHAGOGASTRODUODENOSCOPY (EGD) WITH PROPOFOL: SHX5813

## 2016-03-10 LAB — CBC
HCT: 34.3 % — ABNORMAL LOW (ref 40.0–52.0)
HEMOGLOBIN: 11.9 g/dL — AB (ref 13.0–18.0)
MCH: 32.5 pg (ref 26.0–34.0)
MCHC: 34.7 g/dL (ref 32.0–36.0)
MCV: 93.5 fL (ref 80.0–100.0)
Platelets: 154 10*3/uL (ref 150–440)
RBC: 3.66 MIL/uL — AB (ref 4.40–5.90)
RDW: 13.6 % (ref 11.5–14.5)
WBC: 6.6 10*3/uL (ref 3.8–10.6)

## 2016-03-10 LAB — URINE CULTURE: CULTURE: NO GROWTH

## 2016-03-10 LAB — HEPATIC FUNCTION PANEL
ALBUMIN: 3.1 g/dL — AB (ref 3.5–5.0)
ALT: 138 U/L — ABNORMAL HIGH (ref 17–63)
AST: 90 U/L — AB (ref 15–41)
Alkaline Phosphatase: 100 U/L (ref 38–126)
Bilirubin, Direct: 0.7 mg/dL — ABNORMAL HIGH (ref 0.1–0.5)
Indirect Bilirubin: 1.7 mg/dL — ABNORMAL HIGH (ref 0.3–0.9)
TOTAL PROTEIN: 6 g/dL — AB (ref 6.5–8.1)
Total Bilirubin: 2.4 mg/dL — ABNORMAL HIGH (ref 0.3–1.2)

## 2016-03-10 LAB — BASIC METABOLIC PANEL
ANION GAP: 5 (ref 5–15)
BUN: 21 mg/dL — ABNORMAL HIGH (ref 6–20)
CHLORIDE: 109 mmol/L (ref 101–111)
CO2: 24 mmol/L (ref 22–32)
Calcium: 8.4 mg/dL — ABNORMAL LOW (ref 8.9–10.3)
Creatinine, Ser: 0.88 mg/dL (ref 0.61–1.24)
GFR calc non Af Amer: >60 mL/min (ref >60)
Glucose, Bld: 87 mg/dL (ref 65–99)
POTASSIUM: 3.2 mmol/L — AB (ref 3.5–5.1)
Sodium: 138 mmol/L (ref 135–145)

## 2016-03-10 LAB — HCV RNA QUANT: HCV Quantitative: NOT DETECTED IU/mL (ref >50)

## 2016-03-10 LAB — HEMOGLOBIN A1C
Hgb A1c MFr Bld: 5.5 % (ref 4.8–5.6)
Mean Plasma Glucose: 111 mg/dL

## 2016-03-10 LAB — HEPATITIS B SURFACE ANTIGEN: Hepatitis B Surface Ag: NEGATIVE

## 2016-03-10 SURGERY — ESOPHAGOGASTRODUODENOSCOPY (EGD) WITH PROPOFOL
Anesthesia: General

## 2016-03-10 MED ORDER — BISACODYL 10 MG RE SUPP
10.0000 mg | Freq: Once | RECTAL | Status: AC
  Administered 2016-03-10: 10 mg via RECTAL
  Filled 2016-03-10: qty 1

## 2016-03-10 MED ORDER — PROPOFOL 500 MG/50ML IV EMUL
INTRAVENOUS | Status: DC | PRN
  Administered 2016-03-10: 140 ug/kg/min via INTRAVENOUS

## 2016-03-10 MED ORDER — EPHEDRINE SULFATE 50 MG/ML IJ SOLN
INTRAMUSCULAR | Status: DC | PRN
  Administered 2016-03-10: 10 mg via INTRAVENOUS

## 2016-03-10 MED ORDER — PHENYLEPHRINE HCL 10 MG/ML IJ SOLN
INTRAMUSCULAR | Status: DC | PRN
  Administered 2016-03-10: 100 ug via INTRAVENOUS

## 2016-03-10 MED ORDER — SODIUM CHLORIDE 0.9 % IV SOLN
INTRAVENOUS | Status: DC
  Administered 2016-03-10 (×2): via INTRAVENOUS

## 2016-03-10 MED ORDER — PROPOFOL 10 MG/ML IV BOLUS
INTRAVENOUS | Status: DC | PRN
  Administered 2016-03-10: 20 mg via INTRAVENOUS
  Administered 2016-03-10: 10 mg via INTRAVENOUS
  Administered 2016-03-10: 40 mg via INTRAVENOUS

## 2016-03-10 MED ORDER — FLEET ENEMA 7-19 GM/118ML RE ENEM
1.0000 | ENEMA | Freq: Every day | RECTAL | Status: DC
  Administered 2016-03-10 – 2016-03-11 (×2): 1 via RECTAL

## 2016-03-10 MED ORDER — POTASSIUM CHLORIDE 20 MEQ PO PACK
40.0000 meq | PACK | Freq: Once | ORAL | Status: AC
  Administered 2016-03-10: 40 meq via ORAL
  Filled 2016-03-10: qty 2

## 2016-03-10 MED ORDER — LIDOCAINE HCL (CARDIAC) 20 MG/ML IV SOLN
INTRAVENOUS | Status: DC | PRN
Start: 2016-03-10 — End: 2016-03-10
  Administered 2016-03-10: 100 mg via INTRATRACHEAL

## 2016-03-10 NOTE — Progress Notes (Signed)
Initial Nutrition Assessment  DOCUMENTATION CODES:   Not applicable  INTERVENTION:  Advance diet per MD.  Per family patient will require assistance ordering meals and eating once diet advanced if no family is present.  Will monitor if oral nutrition supplement needed upon diet advancement.  NUTRITION DIAGNOSIS:   Inadequate oral intake related to inability to eat as evidenced by NPO status.  GOAL:   Patient will meet greater than or equal to 90% of their needs  MONITOR:   Diet advancement, Labs, Weight trends, I & O's  REASON FOR ASSESSMENT:   Low Braden    ASSESSMENT:   82 year old male with past medical history of Alzheimer's dementia, COPD, GERD who presented to the hospital due to coffee-ground emesis. Suspected upper GI bleed, fecal impaction (now improved s/p enema), also found to have abnormal LFTs of unclear etiology.   Spoke with patient and family at bedside. Patient unable to contribute much to nutrition history, but wife able to provide most answers. Patient's appetite is good and he usually eats 2 meals per day with snacks in between. He does not drink oral nutrition supplements regularly. The coffee-ground emesis had occurred one time before about 2-3 months ago but the family didn't think anything of it per report. Denies abdominal pain. Reports constipation improved after enema. Sometimes difficulty swallowing pills, but no reported difficulty chewing/swallowing food. Per family patient will require assistance ordering meals and eating if family is not present. Family is unsure of UBW or if patient has lost any weight, though wife was surprised that patient weighed 172 lbs (thought he weighed less). Per weights in Care Everywhere, patient weight stable at 172-174 lbs for the past year and in 2015 he was 167 lbs.  Medications reviewed and include: pantoprazole.  Labs reviewed: CBG 85, Potassium 3.2, BUN 21, Albumin 3.1, AST 90, ALT 138, D Bili 0.7, T Bili  2.4.  Nutrition-Focused physical exam completed. Findings are moderate fat depletion, moderate muscle depletion. Unable to assess lower body. Patient is not ambulatory so some degree of wasting expected.   Diet Order:  Diet NPO time specified Except for: Sips with Meds  Skin:  Reviewed, no issues  Last BM:  03/08/2016  Height:   Ht Readings from Last 1 Encounters:  03/08/16 6' (1.829 m)    Weight:   Wt Readings from Last 1 Encounters:  03/08/16 172 lb 3.2 oz (78.1 kg)    Ideal Body Weight:  80.9 kg  BMI:  Body mass index is 23.35 kg/m.  Estimated Nutritional Needs:   Kcal:  1800-2000 (MSJ x 1.2-1.3)  Protein:  80-95 grams (1-1.2 grams/kg)  Fluid:  >/= 1.9 L/day (25 ml/kg)  EDUCATION NEEDS:   No education needs identified at this time  Helane RimaLeanne Korin Hartwell, MS, RD, LDN Pager: [phone] After Hours Pager: [phone]

## 2016-03-10 NOTE — Progress Notes (Signed)
Patient ID: Joshua Vaughn, male   DOB: 02/26/1934, 82 y.o.   MRN: [id number]  [facility] PROGRESS NOTE  Joshua Vaughn NWG:[id number] DOB: 09/17/1933 DOA: 03/08/2016 PCP: Danella PentonMark F Miller, MD  HPI/Subjective: Patient is a poor historian secondary to dementia. States he does not feel well but can't elaborate. Having some trouble with bowel movements. Came in with coffee-ground emesis.  Objective: Vitals:   03/10/16 0730 03/10/16 1252  BP: 105/62 119/61  Pulse: 69 73  Resp:  18  Temp: 98.2 F (36.8 C) 98.1 F (36.7 C)    Filed Weights   03/08/16 1843  Weight: 78.1 kg (172 lb 3.2 oz)    ROS: Review of Systems  Unable to perform ROS: Dementia  Respiratory: Negative for shortness of breath.   Cardiovascular: Negative for chest pain.  Gastrointestinal: Negative for abdominal pain.   Exam: Physical Exam  HENT:  Nose: No mucosal edema.  Mouth/Throat: No oropharyngeal exudate or posterior oropharyngeal edema.  Eyes: Conjunctivae, EOM and lids are normal. Pupils are equal, round, and reactive to light.  Neck: No JVD present. Carotid bruit is not present. No edema present. No thyroid mass and no thyromegaly present.  Cardiovascular: S1 normal and S2 normal.  Exam reveals no gallop.   Murmur heard.  Systolic murmur is present with a grade of 2/6  Pulses:      Dorsalis pedis pulses are 2+ on the right side, and 2+ on the left side.  Respiratory: No respiratory distress. He has decreased breath sounds in the right lower field and the left lower field. He has no wheezes. He has no rhonchi. He has no rales.  GI: Soft. Bowel sounds are normal. There is no tenderness.  Musculoskeletal:       Right ankle: He exhibits swelling.       Left ankle: He exhibits swelling.  Lymphadenopathy:    He has no cervical adenopathy.  Neurological: He is alert.  Skin: Skin is warm. No rash noted. Nails show no clubbing.  Psychiatric: He has a normal mood and affect.      Data Reviewed: Basic  Metabolic Panel:  Recent Labs Lab 03/08/16 1420 03/09/16 0621 03/10/16 0455  NA 137 138 138  K 3.4* 3.7 3.2*  CL 104 110 109  CO2 25 23 24   GLUCOSE 117* 97 87  BUN 25* 27* 21*  CREATININE 0.70 0.74 0.88  CALCIUM 8.8* 8.4* 8.4*   Liver Function Tests:  Recent Labs Lab 03/08/16 1420 03/09/16 0621 03/10/16 0455  AST 221* 161* 90*  ALT 206* 186* 138*  ALKPHOS 129* 113 100  BILITOT 5.1* 5.0* 2.4*  PROT 7.2 6.2* 6.0*  ALBUMIN 3.6 3.1* 3.1*    Recent Labs Lab 03/08/16 1420  LIPASE 20   CBC:  Recent Labs Lab 03/08/16 1420 03/08/16 1844 03/09/16 0108 03/09/16 0621 03/10/16 0455  WBC 10.9*  --   --  8.4 6.6  NEUTROABS 8.3*  --   --   --   --   HGB 15.2 15.2 13.9 13.3 11.9*  HCT 43.6 44.1 39.5* 38.1* 34.3*  MCV 92.7  --   --  92.1 93.5  PLT 169  --   --  158 154    CBG:  Recent Labs Lab 03/08/16 1721 03/08/16 2125 03/09/16 0729 03/09/16 1146  GLUCAP 118* 116* 90 85      Studies: Ct Abdomen Pelvis Wo Contrast  Result Date: 03/08/2016 CLINICAL DATA:  Abdominal distension, no bowel movement for 4 days EXAM: CT  ABDOMEN AND PELVIS WITHOUT CONTRAST TECHNIQUE: Multidetector CT imaging of the abdomen and pelvis was performed following the standard protocol without IV contrast. COMPARISON:  None. FINDINGS: Lower chest: Lung bases shows mild posterior atelectasis. Extensive atherosclerotic calcifications of coronary arteries. Borderline cardiomegaly. Small hiatal hernia. Hepatobiliary: Unenhanced liver shows no biliary ductal dilatation. Gallbladder is contracted. Small calcified gallstones are noted within gallbladder the largest measures 5 mm. Pancreas: Unenhanced pancreas is normal. Spleen: Unenhanced spleen is normal. Adrenals/Urinary Tract: No adrenal gland mass. No nephrolithiasis. No hydronephrosis or hydroureter. No calcified ureteral calculi. The urinary bladder content measures 30 Hounsfield in attenuation. This may be due to proteinaceous or hemorrhagic  products. Clinical correlation is necessary. Stomach/Bowel: No small bowel obstruction. No pericecal inflammation. The terminal ileum is unremarkable. Normal appendix partially visualized in axial image 58. No evidence of acute colitis. Moderate gas noted in transverse colon. There is abundant stool within rectum. The rectum measures at least 8.8 cm in diameter consistent with fecal impaction. Moderate stool noted in distal sigmoid colon. Vascular/Lymphatic: No aortic aneurysm.  No adenopathy. Reproductive: Prostate gland and seminal vesicles are unremarkable. The urinary bladder is unremarkable. Other: No ascites or free abdominal air. Bilateral inguinal scrotal canal hernia containing fat without evidence of acute complication. Musculoskeletal: Sagittal images of the spine shows diffuse osteopenia. Mild degenerative changes thoracolumbar spine. Schmorl's node deformity noted upper endplate of L2 and L3 vertebral body. Minimal compression deformity upper endplate of L2 vertebral body. Schmorl's node deformity upper endplate of L4 vertebral body. There is disc space flattening with vacuum disc phenomenon at L5-S1 level. IMPRESSION: 1. There is abundant stool within rectum. The rectum is distended with stool measures at least 8.8 cm in diameter consistent with significant fecal impaction. Moderate stool noted in distal sigmoid colon. 2. High-density material within urinary bladder may represent proteinaceous or hemorrhagic products. Clinical correlation is necessary. No hydronephrosis or hydroureter. No nephrolithiasis. 3. No pericecal inflammation. Normal appendix partially visualized. Moderate gas noted within transverse colon. 4. No small bowel obstruction. 5. Extensive atherosclerotic calcifications of coronary arteries. 6. Degenerative changes lumbar spine as described above. Electronically Signed   By: Natasha Mead M.D.   On: 03/08/2016 15:26   US Abdomen Complete  Result Date: 03/09/2016 CLINICAL DATA:   Elevated LFTs EXAM: ABDOMEN ULTRASOUND COMPLETE COMPARISON:  None. FINDINGS: Gallbladder: There is nondistended gallbladder filled with stones. No sonographic Murphy's sign. Common bile duct: Diameter: 2.5 mm in diameter within normal limits Liver: No focal lesion identified. Within normal limits in parenchymal echogenicity. IVC: No abnormality visualized. Pancreas: Visualized portion unremarkable. Spleen: Size and appearance within normal limits. Measures 6.4 cm in length Right Kidney: Length: 10.7 cm. Echogenicity within normal limits. No mass or hydronephrosis visualized. Left Kidney: Length: 11.3 cm. Echogenicity within normal limits. No mass or hydronephrosis visualized. Abdominal aorta: No aneurysm visualized. Measures up to 2.2 cm in diameter. Other findings: None. IMPRESSION: 1. There is contracted gallbladder filled with stones. No sonographic Murphy's sign. 2. Normal CBD. 3. No focal hepatic mass. 4. No hydronephrosis. Electronically Signed   By: Natasha Mead M.D.   On: 03/09/2016 15:12    Scheduled Meds: . memantine  10 mg Oral Daily  . [START ON 03/12/2016] pantoprazole  40 mg Intravenous Q12H   Continuous Infusions: . pantoprozole (PROTONIX) infusion 8 mg/hr (03/10/16 0735)    Assessment/Plan:  1. Upper GI bleed with coffee-ground emesis. On Protonix drip. Hemoglobin stable. No further vomiting. Endoscopy today. 2. Abnormal liver function tests. CT scan of the abdomen and  pelvis and ultrasound doesn't really show much that would cause elevated liver function tests. Continue to monitor LFTs. They're trending in the right direction. 3. CT scan showed proteinaceous and hemorrhagic products in the urinary bladder. Seen by urology and they signed off. 4. Fecal impaction. Start enema and suppository later on this evening after endoscopy today. 5. Hypokalemia replace potassium later on today.  Code Status:     Code Status Orders        Start     Ordered   03/08/16 1928  Do not attempt  resuscitation (DNR)  Continuous    Question Answer Comment  In the event of cardiac or respiratory ARREST Do not call a "code blue"   In the event of cardiac or respiratory ARREST Do not perform Intubation, CPR, defibrillation or ACLS   In the event of cardiac or respiratory ARREST Use medication by any route, position, wound care, and other measures to relive pain and suffering. May use oxygen, suction and manual treatment of airway obstruction as needed for comfort.   Comments rn may pronounce      03/08/16 1927    Code Status History    Date Active Date Inactive Code Status Order ID Comments User Context   03/08/2016  6:32 PM 03/08/2016  7:27 PM Full Code [id number]  Ramonita LabAruna Gouru, MD ED    Advance Directive Documentation   Flowsheet Row Most Recent Value  Type of Advance Directive  Healthcare Power of Attorney  Pre-existing out of facility DNR order (yellow form or pink MOST form)  No data  "MOST" Form in Place?  No data     Family Communication: Family at bedside prior to procedure Disposition Plan: Depending on results of endoscopy may be able to go home tomorrow  Consultants:  Gastroenterology  Time spent: 28 minutes  Alford HighlandWIETING, Cambren Helm  [employer]

## 2016-03-10 NOTE — Care Management Important Message (Signed)
Important Message  Patient Details  Name: Joshua Vaughn MRN: [id number] Date of Birth: 07/10/1933   Medicare Important Message Given:  Yes    Gwenette GreetBrenda S Lamark Schue, RN 03/10/2016, 9:02 AM

## 2016-03-10 NOTE — Op Note (Signed)
[name] Gastroenterology Patient Name: Joshua MeadJoe Parrott Procedure Date: 03/10/2016 4:03 PM MRN: [id number] Account #: [id number] Date of Birth: 04/03/1934 Admit Type: Inpatient Age: 82 [facility] Gender: Male Note Status: Finalized Procedure:            Upper GI endoscopy Indications:          Melena Providers:            Scot Junobert T. Shaw Dobek, MD Referring MD:         Danella PentonMark F. Miller, MD (Referring MD) Medicines:            Propofol per Anesthesia Complications:        No immediate complications. Procedure:            Pre-Anesthesia Assessment:                       - After reviewing the risks and benefits, the patient                        was deemed in satisfactory condition to undergo the                        procedure.                       After obtaining informed consent, the endoscope was                        passed under direct vision. Throughout the procedure,                        the patient's blood pressure, pulse, and oxygen                        saturations were monitored continuously. The Endoscope                        was introduced through the mouth, and advanced to the                        second part of duodenum. The upper GI endoscopy was                        accomplished without difficulty. The patient tolerated                        the procedure well. Findings:      The examined esophagus was normal. Mucosal small irritation at GEJ, two       very small spots.      A small hiatal hernia was present.      Three small no bleeding angioectasias were found in the gastric body.       Coagulation for tissue destruction using argon plasma at 0.4       liters/minute and 30 watts was successful. For hemostasis, five       hemostatic clips were successfully placed. There was no bleeding at the       end of the procedure.      The examined duodenum was normal. Impression:           - Normal esophagus.                       -  Small hiatal hernia.                       - Three non-bleeding angioectasias in the stomach.                        Treated with argon plasma coagulation (APC). Clips were                        placed.                       - Normal examined duodenum.                       - No specimens collected. Recommendation:       - Await pathology results. Scot Junobert T Jacoby Zanni, MD 03/10/2016 4:32:00 PM This report has been signed electronically. Number of Addenda: 0 Note Initiated On: 03/10/2016 4:03 PM      [facility]

## 2016-03-10 NOTE — Anesthesia Preprocedure Evaluation (Signed)
Anesthesia Evaluation  Patient identified by MRN, date of birth, ID band Patient awake    Reviewed: Allergy & Precautions, NPO status , Patient's Chart, lab work & pertinent test results  Airway Mallampati: II       Dental  (+) Upper Dentures, Lower Dentures   Pulmonary neg pulmonary ROS,    Pulmonary exam normal        Cardiovascular negative cardio ROS Normal cardiovascular exam     Neuro/Psych PSYCHIATRIC DISORDERS dementia    GI/Hepatic Neg liver ROS, GERD  ,  Endo/Other  negative endocrine ROS  Renal/GU negative Renal ROS  negative genitourinary   Musculoskeletal negative musculoskeletal ROS (+)   Abdominal Normal abdominal exam  (+)   Peds negative pediatric ROS (+)  Hematology negative hematology ROS (+)   Anesthesia Other Findings   Reproductive/Obstetrics                             Anesthesia Physical Anesthesia Plan  ASA: III  Anesthesia Plan: General   Post-op Pain Management:    Induction: Intravenous  Airway Management Planned: Nasal Cannula  Additional Equipment:   Intra-op Plan:   Post-operative Plan:   Informed Consent: I have reviewed the patients History and Physical, chart, labs and discussed the procedure including the risks, benefits and alternatives for the proposed anesthesia with the patient or authorized representative who has indicated his/her understanding and acceptance.   Dental advisory given  Plan Discussed with: CRNA and Surgeon  Anesthesia Plan Comments:         Anesthesia Quick Evaluation

## 2016-03-10 NOTE — Anesthesia Postprocedure Evaluation (Signed)
Anesthesia Post Note  Patient: Joshua FellersJoe P Vaughn  Procedure(s) Performed: Procedure(s) (LRB): ESOPHAGOGASTRODUODENOSCOPY (EGD) WITH PROPOFOL (N/A)  Patient location during evaluation: PACU Anesthesia Type: General Level of consciousness: awake and alert and oriented Pain management: pain level controlled Vital Signs Assessment: post-procedure vital signs reviewed and stable Respiratory status: spontaneous breathing Cardiovascular status: blood pressure returned to baseline Anesthetic complications: no    Last Vitals:  Vitals:   03/10/16 1709 03/10/16 1737  BP: 119/67 120/65  Pulse: 72 70  Resp: 20 20  Temp:      Last Pain:  Vitals:   03/10/16 1649  TempSrc:   PainSc: Asleep                 Royale Lennartz

## 2016-03-10 NOTE — Transfer of Care (Signed)
Immediate Anesthesia Transfer of Care Note  Patient: Joshua FellersJoe P Daffern  Procedure(s) Performed: Procedure(s): ESOPHAGOGASTRODUODENOSCOPY (EGD) WITH PROPOFOL (N/A)  Patient Location: PACU  Anesthesia Type:General  Level of Consciousness: sedated  Airway & Oxygen Therapy: Patient Spontanous Breathing and Patient connected to nasal cannula oxygen  Post-op Assessment: Report given to RN and Post -op Vital signs reviewed and stable  Post vital signs: Reviewed and stable  Last Vitals:  Vitals:   03/10/16 1530 03/10/16 1639  BP: 125/61 125/64  Pulse: 72 74  Resp: 16 17  Temp: 36.8 C (!) 35.6 C    Last Pain:  Vitals:   03/10/16 1639  TempSrc: Tympanic  PainSc: Asleep         Complications: No apparent anesthesia complications

## 2016-03-10 NOTE — Care Management (Signed)
Admitted to [facility] with the diagnosis of coffee ground emesis. Lives with wife, Lynnell DikeSusie [phone]). Last seen Dr. Hyacinth MeekerMiller about 60 days ago. Palliative care has been in the home, not not there anymore. No skilled facility. No home oxygen. [facility] Physical therapy in the home in the past. Doesn't remember name of agency. No falls, but sometimes slides to the floor with transfers.  Good appetite. Prescriptions are filled at [facility] on [address]. Wife helps with baths, dressing and sometimes feeding. Hoyer lift, lift chair, bedside commode and bed rail in the home. Wife indicated that bed rail was broken, would like a new one, if possible. Will check with [facility] representative.  Sitter list and personal care service list given to Ms. Lapage.  Gwenette GreetBrenda S Nyquan Selbe RN MSN CCM Care Management

## 2016-03-10 NOTE — Progress Notes (Signed)
Inpatient Diabetes Program Recommendations  AACE/ADA: New Consensus Statement on Inpatient Glycemic Control (2015)  Target Ranges:  Prepandial:   less than 140 mg/dL      Peak postprandial:   less than 180 mg/dL (1-2 hours)      Critically ill patients:  140 - 180 mg/dL   Results for Joshua Vaughn, Joshua Vaughn (MRN [id number]) as of 03/10/2016 09:07  Ref. Range 03/08/2016 17:21 03/08/2016 21:25 03/09/2016 07:29 03/09/2016 11:46  Glucose-Capillary Latest Ref Range: 65 - 99 mg/dL 782118 (H) 956116 (H) 90 85    Admit with: GIB  History: DM2, Dementia  Home DM Meds: None  Current Insulin Orders: None      MD- Patient with History of Type 2 DM per H&Vaughn notes.  Please consider placing order for CBG checks TID AC + HS while patient hospitalized       --Will follow patient during hospitalization--  Ambrose FinlandJeannine Johnston Lissa Rowles RN, MSN, CDE Diabetes Coordinator Inpatient Glycemic Control Team Team Pager: [phone] (8a-5p)

## 2016-03-11 ENCOUNTER — Encounter: Payer: Self-pay | Admitting: Unknown Physician Specialty

## 2016-03-11 LAB — HEMOGLOBIN: HEMOGLOBIN: 12.4 g/dL — AB (ref 13.0–18.0)

## 2016-03-11 MED ORDER — FENTANYL CITRATE (PF) 100 MCG/2ML IJ SOLN: 25.0000 ug | INTRAMUSCULAR | Status: DC | PRN

## 2016-03-11 MED ORDER — ONDANSETRON HCL 4 MG/2ML IJ SOLN: 4.0000 mg | Freq: Once | INTRAMUSCULAR | Status: DC | PRN

## 2016-03-11 NOTE — Progress Notes (Signed)
Patient ID: Joshua Vaughn, male   DOB: 09/24/1933, 82 y.o.   MRN: [id number]  [employer] PROGRESS NOTE  Joshua Vaughn JYN:[id number] DOB: 01/21/1934 DOA: 03/08/2016 PCP: Danella PentonMark F Miller, MD  HPI/Subjective: Patient limited historian secondary to dementia. States he feels okay. Occasional abdominal pain. Had bowel movements last night with enema and suppository.  Objective: Vitals:   03/11/16 0755 03/11/16 1254  BP: 133/63 135/68  Pulse: 66 67  Resp: 16   Temp: 97.5 F (36.4 C) 98.6 F (37 C)    Filed Weights   03/08/16 1843 03/10/16 1530  Weight: 78.1 kg (172 lb 3.2 oz) 78 kg (172 lb)    ROS: Review of Systems  Unable to perform ROS: Dementia  Respiratory: Negative for shortness of breath.   Cardiovascular: Negative for chest pain.  Gastrointestinal: Positive for abdominal pain.   Exam: Physical Exam  HENT:  Nose: No mucosal edema.  Mouth/Throat: No oropharyngeal exudate or posterior oropharyngeal edema.  Eyes: Conjunctivae, EOM and lids are normal. Pupils are equal, round, and reactive to light.  Neck: No JVD present. Carotid bruit is not present. No edema present. No thyroid mass and no thyromegaly present.  Cardiovascular: S1 normal and S2 normal.  Exam reveals no gallop.   Murmur heard.  Systolic murmur is present with a grade of 2/6  Pulses:      Dorsalis pedis pulses are 2+ on the right side, and 2+ on the left side.  Respiratory: No respiratory distress. He has decreased breath sounds in the right lower field and the left lower field. He has no wheezes. He has no rhonchi. He has no rales.  GI: Soft. Bowel sounds are normal. There is no tenderness.  Musculoskeletal:       Right ankle: He exhibits swelling.       Left ankle: He exhibits swelling.  Lymphadenopathy:    He has no cervical adenopathy.  Neurological: He is alert.  Skin: Skin is warm. No rash noted. Nails show no clubbing.  Psychiatric: He has a normal mood and affect.      Data  Reviewed: Basic Metabolic Panel:  Recent Labs Lab 03/08/16 1420 03/09/16 0621 03/10/16 0455  NA 137 138 138  K 3.4* 3.7 3.2*  CL 104 110 109  CO2 25 23 24   GLUCOSE 117* 97 87  BUN 25* 27* 21*  CREATININE 0.70 0.74 0.88  CALCIUM 8.8* 8.4* 8.4*   Liver Function Tests:  Recent Labs Lab 03/08/16 1420 03/09/16 0621 03/10/16 0455  AST 221* 161* 90*  ALT 206* 186* 138*  ALKPHOS 129* 113 100  BILITOT 5.1* 5.0* 2.4*  PROT 7.2 6.2* 6.0*  ALBUMIN 3.6 3.1* 3.1*    Recent Labs Lab 03/08/16 1420  LIPASE 20   CBC:  Recent Labs Lab 03/08/16 1420 03/08/16 1844 03/09/16 0108 03/09/16 0621 03/10/16 0455 03/11/16 0928  WBC 10.9*  --   --  8.4 6.6  --   NEUTROABS 8.3*  --   --   --   --   --   HGB 15.2 15.2 13.9 13.3 11.9* 12.4*  HCT 43.6 44.1 39.5* 38.1* 34.3*  --   MCV 92.7  --   --  92.1 93.5  --   PLT 169  --   --  158 154  --     CBG:  Recent Labs Lab 03/08/16 1721 03/08/16 2125 03/09/16 0729 03/09/16 1146  GLUCAP 118* 116* 90 85       Scheduled Meds: . memantine  10 mg Oral Daily  . [START ON 03/12/2016] pantoprazole  40 mg Intravenous Q12H  . sodium phosphate  1 enema Rectal QHS   Continuous Infusions: . pantoprozole (PROTONIX) infusion 8 mg/hr (03/11/16 0454)    Assessment/Plan:  1. Upper GI bleed with coffee-ground emesis. On Protonix drip. Angiectasia seen on endoscopy yesterday afternoon. Only started on liquid diet today for lunch. Likely discharge tomorrow. 2. Abnormal liver function tests. CT scan of the abdomen and pelvis and ultrasound doesn't really show much that would cause elevated liver function tests. Continue to monitor LFTs. They're trending in the right direction. 3. CT scan showed proteinaceous and hemorrhagic products in the urinary bladder. Seen by urology and they signed off. 4. Fecal impaction. Enema daily at bedtime 5. Hypokalemia replaced yesterday  Code Status:     Code Status Orders        Start     Ordered    03/08/16 1928  Do not attempt resuscitation (DNR)  Continuous    Question Answer Comment  In the event of cardiac or respiratory ARREST Do not call a "code blue"   In the event of cardiac or respiratory ARREST Do not perform Intubation, CPR, defibrillation or ACLS   In the event of cardiac or respiratory ARREST Use medication by any route, position, wound care, and other measures to relive pain and suffering. May use oxygen, suction and manual treatment of airway obstruction as needed for comfort.   Comments rn may pronounce      03/08/16 1927    Code Status History    Date Active Date Inactive Code Status Order ID Comments User Context   03/08/2016  6:32 PM 03/08/2016  7:27 PM Full Code [id number]  Ramonita LabAruna Gouru, MD ED    Advance Directive Documentation   Flowsheet Row Most Recent Value  Type of Advance Directive  Healthcare Power of Attorney  Pre-existing out of facility DNR order (yellow form or pink MOST form)  No data  "MOST" Form in Place?  No data     Family Communication: Family at bedside. Disposition Plan: Likely discharge home tomorrow  Consultants:  Gastroenterology  Time spent: 24 minutes  Alford HighlandWIETING, Promiss Labarbera  [employer]

## 2016-03-11 NOTE — Plan of Care (Signed)
Problem: Fluid Volume: Goal: Ability to maintain a balanced intake and output will improve Outcome: Not Progressing Pt had endoscopy yesterday. Still on clear liquids.   Problem: Bowel/Gastric: Goal: Will not experience complications related to bowel motility Outcome: Progressing Pt had Dulcolax suppository and Fleets enema with positive results.

## 2016-03-11 NOTE — Progress Notes (Signed)
PT Cancellation Note  Patient Details Name: Joshua Vaughn MRN: [id number] DOB: 10/31/1933   Cancelled Treatment:    Reason Eval/Treat Not Completed: Other (comment). Consult received and chart reviewed. Spoke to wife in room as pt with severe dementia with activity pad on bed. Pt is s/p EGD on 11/27 secondary to upper GI bleed. Pt is total assist at baseline, uses lift from bed->WC and wife assist with bathing and dressing. Wife also reports they have a ramp as well to enter/exit home, although pt rarely leaves home. Wife expresses heavy burden with assist required to care for husband. Requesting any outside resources available, although limited funds available. Discussed with MD. Pt does not qualify for PT at this time as he is baseline bedbound status. Wife expresses need for new bed rail to assist at home. Will dc current order. Please re-order if needs change.   Clanton Emanuelson 03/11/2016, 12:35 PM  Elizabeth PalauStephanie Jae Bruck, PT, DPT [phone]

## 2016-03-11 NOTE — Progress Notes (Signed)
Chaplain was making his rounds and visited with pt in room 102. Provided the ministry of prayer and a spiritual presence.    03/11/16 1250  Clinical Encounter Type  Visited With Patient;Patient and family together  Visit Type Initial;Spiritual support  Referral From Nurse  Spiritual Encounters  Spiritual Needs Prayer

## 2016-03-12 MED ORDER — PANTOPRAZOLE SODIUM 40 MG PO TBEC: 40.0000 mg | DELAYED_RELEASE_TABLET | Freq: Two times a day (BID) | ORAL | Status: DC

## 2016-03-12 MED ORDER — PANTOPRAZOLE SODIUM 40 MG PO TBEC: 40.0000 mg | DELAYED_RELEASE_TABLET | Freq: Two times a day (BID) | ORAL | 0 refills | Status: DC

## 2016-03-12 MED ORDER — DOCUSATE SODIUM 100 MG PO CAPS: 100.0000 mg | ORAL_CAPSULE | Freq: Two times a day (BID) | ORAL | 0 refills | Status: AC

## 2016-03-12 MED ORDER — LACTULOSE 10 GM/15ML PO SOLN: 20.0000 g | Freq: Every day | ORAL | Status: DC | PRN

## 2016-03-12 MED ORDER — LACTULOSE 10 GM/15ML PO SOLN: 20.0000 g | Freq: Every day | ORAL | 0 refills | Status: DC | PRN

## 2016-03-12 NOTE — Discharge Planning (Signed)
Pt IV removed. Discharge papers given, explained and educated with wife.  Informed of suggested FU appt and scripts sent to [facility] ([address]  RN assessment and VS revealed stability for DC to home.  EMS will be called to transport home.  Wife agrees to get patient needed bed rails.

## 2016-03-12 NOTE — Discharge Summary (Signed)
[facility] - [place] at [facility]   PATIENT NAME: Joshua MeadJoe Vaughn    MR#:  [id number]  DATE OF BIRTH:  09/15/1933  DATE OF ADMISSION:  03/08/2016 ADMITTING PHYSICIAN: Ramonita LabAruna Gouru, MD  DATE OF DISCHARGE: 03/12/2016  PRIMARY CARE PHYSICIAN: Danella PentonMark F Miller, MD    ADMISSION DIAGNOSIS:  Gastrointestinal hemorrhage associated with acute gastritis [K29.01]  DISCHARGE DIAGNOSIS:  Active Problems:   Coffee ground emesis   SECONDARY DIAGNOSIS:   Past Medical History:  Diagnosis Date  . Dementia   . GERD (gastroesophageal reflux disease)     HOSPITAL COURSE:   1. Upper GI bleed with coffee ground emesis. EGD showing angiectasia is which were treated at the time of endoscopy. Please see endoscopy report. Patient was on Protonix drip the entire time here in the hospital. Patient was discharged on Protonix twice a day. Aspirin stopped. Told wife its Tylenol only for pain 2. Abnormal liver function tests. Liver function trended better. CT scan and abdominal ultrasound unremarkable. Ultrasound did show gallstones and a contracted gallbladder but no signs of acute cholecystitis or dilated ducts. The patient may have had a stone that lodged and then passed. Continue to monitor as outpatient. With the patient's history of dementia, conservative management at this point. 3. CT scan showed proteinaceous and hemorrhagic products and the urinary bladder. Patient was seen by urology and they signed off. 4. Fecal impaction. I ended up giving enemas on a daily basis while here with good results. Increase Colace as outpatient and lactulose when necessary severe constipation 5. Hypokalemia replaced yesterday 6. Dementia. Patient does not walk and cannot participate in physical therapy. Wife like to take him home.  DISCHARGE CONDITIONS:   Satisfactory  CONSULTS OBTAINED:  Treatment Team:  Scot Junobert T Elliott, MD  DRUG ALLERGIES:  No Known Allergies  DISCHARGE MEDICATIONS:   Current  Discharge Medication List    START taking these medications   Details  lactulose (CHRONULAC) 10 GM/15ML solution Take 30 mLs (20 g total) by mouth daily as needed for severe constipation. Qty: 473 mL, Refills: 0    pantoprazole (PROTONIX) 40 MG tablet Take 1 tablet (40 mg total) by mouth 2 (two) times daily. Qty: 60 tablet, Refills: 0      CONTINUE these medications which have CHANGED   Details  docusate sodium (COLACE) 100 MG capsule Take 1 capsule (100 mg total) by mouth 2 (two) times daily. Qty: 60 capsule, Refills: 0      CONTINUE these medications which have NOT CHANGED   Details  memantine (NAMENDA) 10 MG tablet 10 mg 2 (two) times daily.       STOP taking these medications     aspirin EC 81 MG tablet          DISCHARGE INSTRUCTIONS:   Follow-up as outpatient with PMD one week  If you experience worsening of your admission symptoms, develop shortness of breath, life threatening emergency, suicidal or homicidal thoughts you must seek medical attention immediately by calling 911 or calling your MD immediately  if symptoms less severe.  You Must read complete instructions/literature along with all the possible adverse reactions/side effects for all the Medicines you take and that have been prescribed to you. Take any new Medicines after you have completely understood and accept all the possible adverse reactions/side effects.   Please note  You were cared for by a hospitalist during your hospital stay. If you have any questions about your discharge medications or the care you received while you  were in the hospital after you are discharged, you can call the unit and asked to speak with the hospitalist on call if the hospitalist that took care of you is not available. Once you are discharged, your primary care physician will handle any further medical issues. Please note that NO REFILLS for any discharge medications will be authorized once you are discharged, as it is  imperative that you return to your primary care physician (or establish a relationship with a primary care physician if you do not have one) for your aftercare needs so that they can reassess your need for medications and monitor your lab values.    Today   CHIEF COMPLAINT:   Chief Complaint  Patient presents with  . Emesis    HISTORY OF PRESENT ILLNESS:  [name]  is a 82 y.o. male presented with coffee-ground emesis   VITAL SIGNS:  Blood pressure (!) 120/58, pulse 77, temperature 98 F (36.7 C), resp. rate 18, height 6' (1.829 m), weight 78 kg (172 lb), SpO2 95 %.    PHYSICAL EXAMINATION:  GENERAL:  82 y.o.-year-old patient lying in the bed with no acute distress.  EYES: Pupils equal, round, reactive to light and accommodation. No scleral icterus.  HEENT: Head atraumatic, normocephalic. Oropharynx and nasopharynx clear.  NECK:  Supple, no jugular venous distention. No thyroid enlargement, no tenderness.  LUNGS: Normal breath sounds bilaterally, no wheezing, rales,rhonchi or crepitation. No use of accessory muscles of respiration.  CARDIOVASCULAR: S1, S2 normal. No murmurs, rubs, or gallops.  ABDOMEN: Soft, non-tender, non-distended. Bowel sounds present. No organomegaly or mass.  EXTREMITIES: Trace edema. No cyanosis, or clubbing.  NEUROLOGIC: Cranial nerves II through XII are intact. Patient able to lift her legs up off the bed 1 at the time Gait not checked.  PSYCHIATRIC: The patient is alert.  SKIN: No obvious rash, lesion, or ulcer.   DATA REVIEW:   CBC  Recent Labs Lab 03/10/16 0455 03/11/16 0928  WBC 6.6  --   HGB 11.9* 12.4*  HCT 34.3*  --   PLT 154  --     Chemistries   Recent Labs Lab 03/10/16 0455  NA 138  K 3.2*  CL 109  CO2 24  GLUCOSE 87  BUN 21*  CREATININE 0.88  CALCIUM 8.4*  AST 90*  ALT 138*  ALKPHOS 100  BILITOT 2.4*     Microbiology Results  Results for orders placed or performed during the hospital encounter of 03/08/16   Culture, Urine     Status: None   Collection Time: 03/09/16 10:34 AM  Result Value Ref Range Status   Specimen Description URINE, RANDOM  Final   Special Requests NONE  Final   Culture NO GROWTH Performed at [facility]   Final   Report Status 03/10/2016 FINAL  Final    Management plans discussed with the patient, family and they are in agreement.  CODE STATUS:     Code Status Orders        Start     Ordered   03/08/16 1928  Do not attempt resuscitation (DNR)  Continuous    Question Answer Comment  In the event of cardiac or respiratory ARREST Do not call a "code blue"   In the event of cardiac or respiratory ARREST Do not perform Intubation, CPR, defibrillation or ACLS   In the event of cardiac or respiratory ARREST Use medication by any route, position, wound care, and other measures to relive pain and suffering. May use  oxygen, suction and manual treatment of airway obstruction as needed for comfort.   Comments rn may pronounce      03/08/16 1927    Code Status History    Date Active Date Inactive Code Status Order ID Comments User Context   03/08/2016  6:32 PM 03/08/2016  7:27 PM Full Code [id number]  Ramonita LabAruna Gouru, MD ED    Advance Directive Documentation   Flowsheet Row Most Recent Value  Type of Advance Directive  Healthcare Power of Attorney  Pre-existing out of facility DNR order (yellow form or pink MOST form)  No data  "MOST" Form in Place?  No data      TOTAL TIME TAKING CARE OF THIS PATIENT: 35 minutes.    Alford HighlandWIETING, Lamees Gable M.D on 03/12/2016 at 12:17 PM  Between 7am to 6pm - Pager - [phone]  After 6pm go to [email or web address] - password [email or web address]  [facility] Office  [phone]  CC: Primary care physician; Danella PentonMark F Miller, MD

## 2016-11-07 ENCOUNTER — Inpatient Hospital Stay
Admission: EM | Admit: 2016-11-07 | Discharge: 2016-11-12 | DRG: 391 | Disposition: A | Discharge status: Skilled Nursing Facility | Payer: Medicare Other | Attending: Internal Medicine | Admitting: Internal Medicine

## 2016-11-07 ENCOUNTER — Encounter: Payer: Self-pay | Admitting: Emergency Medicine

## 2016-11-07 DIAGNOSIS — Z681 Body mass index (BMI) 19 or less, adult: Secondary | ICD-10-CM | POA: Diagnosis not present

## 2016-11-07 DIAGNOSIS — F0390 Unspecified dementia without behavioral disturbance: Secondary | ICD-10-CM | POA: Diagnosis not present

## 2016-11-07 DIAGNOSIS — Z66 Do not resuscitate: Secondary | ICD-10-CM | POA: Diagnosis present

## 2016-11-07 DIAGNOSIS — Z79899 Other long term (current) drug therapy: Secondary | ICD-10-CM

## 2016-11-07 DIAGNOSIS — K210 Gastro-esophageal reflux disease with esophagitis: Secondary | ICD-10-CM | POA: Diagnosis not present

## 2016-11-07 DIAGNOSIS — E43 Unspecified severe protein-calorie malnutrition: Secondary | ICD-10-CM | POA: Diagnosis not present

## 2016-11-07 DIAGNOSIS — K449 Diaphragmatic hernia without obstruction or gangrene: Secondary | ICD-10-CM | POA: Diagnosis present

## 2016-11-07 DIAGNOSIS — K920 Hematemesis: Secondary | ICD-10-CM | POA: Diagnosis present

## 2016-11-07 NOTE — ED Triage Notes (Signed)
Patient comes in from home via [facility] with 1 episode of coffee ground emesis around 10:30pm . Patient has dementia. Wife told EMS that this has happened before due to bleeding ulcers and they had to cauterize it. Per EMS was a-fib on the monitor at 120bpm. 137/83 bp. 183 CBG. 90% room air. Patient is currently 88% room air. Placed on 2L [place]. Patient is completely dependent, unable to care for himself.

## 2016-11-08 ENCOUNTER — Emergency Department: Payer: Medicare Other

## 2016-11-08 DIAGNOSIS — K920 Hematemesis: Secondary | ICD-10-CM | POA: Diagnosis not present

## 2016-11-08 DIAGNOSIS — K210 Gastro-esophageal reflux disease with esophagitis: Secondary | ICD-10-CM | POA: Diagnosis not present

## 2016-11-08 LAB — COMPREHENSIVE METABOLIC PANEL
ALK PHOS: 60 U/L (ref 38–126)
ALT: 20 U/L (ref 17–63)
ANION GAP: 8 (ref 5–15)
AST: 29 U/L (ref 15–41)
Albumin: 4 g/dL (ref 3.5–5.0)
BILIRUBIN TOTAL: 1.8 mg/dL — AB (ref 0.3–1.2)
BUN: 14 mg/dL (ref 6–20)
CALCIUM: 9.5 mg/dL (ref 8.9–10.3)
CO2: 27 mmol/L (ref 22–32)
Chloride: 100 mmol/L — ABNORMAL LOW (ref 101–111)
Creatinine, Ser: 0.86 mg/dL (ref 0.61–1.24)
Glucose, Bld: 144 mg/dL — ABNORMAL HIGH (ref 65–99)
POTASSIUM: 4.1 mmol/L (ref 3.5–5.1)
SODIUM: 135 mmol/L (ref 135–145)
TOTAL PROTEIN: 7.9 g/dL (ref 6.5–8.1)

## 2016-11-08 LAB — CBC
HEMATOCRIT: 50.2 % (ref 40.0–52.0)
HEMOGLOBIN: 17 g/dL (ref 13.0–18.0)
MCH: 31.8 pg (ref 26.0–34.0)
MCHC: 33.9 g/dL (ref 32.0–36.0)
MCV: 93.8 fL (ref 80.0–100.0)
Platelets: 214 10*3/uL (ref 150–440)
RBC: 5.35 MIL/uL (ref 4.40–5.90)
RDW: 13.7 % (ref 11.5–14.5)
WBC: 13.6 10*3/uL — AB (ref 3.8–10.6)

## 2016-11-08 LAB — TYPE AND SCREEN
ABO/RH(D): O POS
ANTIBODY SCREEN: NEGATIVE

## 2016-11-08 LAB — PROTIME-INR
INR: 1.01
Prothrombin Time: 13.3 seconds (ref 11.4–15.2)

## 2016-11-08 LAB — TSH: TSH: 1.171 u[IU]/mL (ref 0.350–4.500)

## 2016-11-08 MED ORDER — SODIUM CHLORIDE 0.9 % IV SOLN
8.0000 mg/h | INTRAVENOUS | Status: DC
  Administered 2016-11-08 – 2016-11-09 (×3): 8 mg/h via INTRAVENOUS
  Filled 2016-11-08 (×3): qty 80

## 2016-11-08 MED ORDER — ONDANSETRON HCL 4 MG PO TABS: 4.0000 mg | ORAL_TABLET | Freq: Four times a day (QID) | ORAL | Status: DC | PRN

## 2016-11-08 MED ORDER — SODIUM CHLORIDE 0.9 % IV SOLN
80.0000 mg | Freq: Once | INTRAVENOUS | Status: AC
  Administered 2016-11-08: 80 mg via INTRAVENOUS
  Filled 2016-11-08: qty 80

## 2016-11-08 MED ORDER — PANTOPRAZOLE SODIUM 40 MG IV SOLR: 40.0000 mg | Freq: Two times a day (BID) | INTRAVENOUS | Status: DC

## 2016-11-08 MED ORDER — CHLORHEXIDINE GLUCONATE 0.12 % MT SOLN
15.0000 mL | Freq: Two times a day (BID) | OROMUCOSAL | Status: DC
  Administered 2016-11-08 – 2016-11-12 (×6): 15 mL via OROMUCOSAL
  Filled 2016-11-08 (×6): qty 15

## 2016-11-08 MED ORDER — ACETAMINOPHEN 325 MG PO TABS: 650.0000 mg | ORAL_TABLET | Freq: Four times a day (QID) | ORAL | Status: DC | PRN

## 2016-11-08 MED ORDER — MEMANTINE HCL 5 MG PO TABS
10.0000 mg | ORAL_TABLET | Freq: Two times a day (BID) | ORAL | Status: DC
  Administered 2016-11-08 – 2016-11-12 (×8): 10 mg via ORAL
  Filled 2016-11-08 (×9): qty 2

## 2016-11-08 MED ORDER — ORAL CARE MOUTH RINSE
15.0000 mL | Freq: Two times a day (BID) | OROMUCOSAL | Status: DC
  Administered 2016-11-08 – 2016-11-11 (×3): 15 mL via OROMUCOSAL

## 2016-11-08 MED ORDER — ACETAMINOPHEN 650 MG RE SUPP: 650.0000 mg | Freq: Four times a day (QID) | RECTAL | Status: DC | PRN

## 2016-11-08 MED ORDER — LACTULOSE 10 GM/15ML PO SOLN: 20.0000 g | Freq: Every day | ORAL | Status: DC | PRN

## 2016-11-08 MED ORDER — SODIUM CHLORIDE 0.9 % IV SOLN
INTRAVENOUS | Status: DC
  Administered 2016-11-08 (×2): via INTRAVENOUS
  Administered 2016-11-09: 125 mL/h via INTRAVENOUS

## 2016-11-08 MED ORDER — ONDANSETRON HCL 4 MG/2ML IJ SOLN: 4.0000 mg | Freq: Four times a day (QID) | INTRAMUSCULAR | Status: DC | PRN

## 2016-11-08 MED ORDER — DOCUSATE SODIUM 100 MG PO CAPS
100.0000 mg | ORAL_CAPSULE | Freq: Two times a day (BID) | ORAL | Status: DC
  Administered 2016-11-08 – 2016-11-12 (×8): 100 mg via ORAL
  Filled 2016-11-08 (×8): qty 1

## 2016-11-08 NOTE — Progress Notes (Signed)
New Admit  Arrival Method: From ED Mental Orientation: Responds to voice. Disoriented X4. Non-verbal. Unable to follow commands. Telemetry: Yes, NSR Assessment: R lung diminished, L lung clear. HR regular. Pulses 2+ pedal and radial bilaterally. Bowel sounds faint. Wife says pt becomes constipated easily.  VSS. Pt in no distress at this time.  Skin: Closed abrasion to R ankle. Ecchymosis to arms bilaterally.  Iv: Right antecubital Pain: 0 using pain AD scale Safety Measures: yellow armband and signage. Yellow socks. Bed alarm on. Room close to nurses station. Admission: complete with wifes help 1A Orientation: Completed with wife Family: Wife at bedside. POC explained. Wife will update children.

## 2016-11-08 NOTE — H&P (Signed)
Joshua Vaughn is an 83 y.o. male.   Chief Complaint: Hematemesis HPI: The patient with past medical history of GERD and dementia presents to the emergency department after 3 episodes of vomiting, the last of which was bloody. Due to dementia the patient is a poor historian. The patient's wife who is his primary caregiver states that she found him this morning with vomit on his face. He has had hematemesis before requiring cauterization of telangiectasias within the esophagus. The patient's first episode of vomiting this encounter was unwitnessed. The emergency department patient was placed on supplemental oxygen due to sats less than 90. Chest x-ray was clear and the patient was not in respiratory distress. He was placed on a Protonix drip prior to the emergency department staff called the hospitalist service for admission.  Past Medical History:  Diagnosis Date  . Dementia   . GERD (gastroesophageal reflux disease)     Past Surgical History:  Procedure Laterality Date  . ESOPHAGOGASTRODUODENOSCOPY (EGD) WITH PROPOFOL N/A 03/10/2016   Procedure: ESOPHAGOGASTRODUODENOSCOPY (EGD) WITH PROPOFOL;  Surgeon: Manya Silvas, MD;  Location: [facility] ENDOSCOPY;  Service: Endoscopy;  Laterality: N/A;    No family history on file. Patient cannot recall due to dementia Social History:  reports that he has never smoked. He has never used smokeless tobacco. He reports that he does not drink alcohol or use drugs.  Allergies: No Known Allergies  Medications Prior to Admission  Medication Sig Dispense Refill  . docusate sodium (COLACE) 100 MG capsule Take 1 capsule (100 mg total) by mouth 2 (two) times daily. 60 capsule 0  . memantine (NAMENDA) 10 MG tablet 10 mg 2 (two) times daily.       Results for orders placed or performed during the hospital encounter of 11/07/16 (from the past 48 hour(s))  Type and screen [place]     Status: None   Collection Time: 11/07/16 12:21 AM  Result  Value Ref Range   ABO/RH(D) O POS    Antibody Screen NEG    Sample Expiration 11/10/2016   Comprehensive metabolic panel     Status: Abnormal   Collection Time: 11/07/16 11:56 PM  Result Value Ref Range   Sodium 135 135 - 145 mmol/L   Potassium 4.1 3.5 - 5.1 mmol/L   Chloride 100 (L) 101 - 111 mmol/L   CO2 27 22 - 32 mmol/L   Glucose, Bld 144 (H) 65 - 99 mg/dL   BUN 14 6 - 20 mg/dL   Creatinine, Ser 0.86 0.61 - 1.24 mg/dL   Calcium 9.5 8.9 - 10.3 mg/dL   Total Protein 7.9 6.5 - 8.1 g/dL   Albumin 4.0 3.5 - 5.0 g/dL   AST 29 15 - 41 U/L   ALT 20 17 - 63 U/L   Alkaline Phosphatase 60 38 - 126 U/L   Total Bilirubin 1.8 (H) 0.3 - 1.2 mg/dL   GFR calc non Af Amer >60 >60 mL/min   GFR calc Af Amer >60 >60 mL/min    Comment: (NOTE) The eGFR has been calculated using the CKD EPI equation. This calculation has not been validated in all clinical situations. eGFR's persistently <60 mL/min signify possible Chronic Kidney Disease.    Anion gap 8 5 - 15  CBC     Status: Abnormal   Collection Time: 11/07/16 11:56 PM  Result Value Ref Range   WBC 13.6 (H) 3.8 - 10.6 K/uL   RBC 5.35 4.40 - 5.90 MIL/uL   Hemoglobin 17.0  13.0 - 18.0 g/dL   HCT 50.2 40.0 - 52.0 %   MCV 93.8 80.0 - 100.0 fL   MCH 31.8 26.0 - 34.0 pg   MCHC 33.9 32.0 - 36.0 g/dL   RDW 13.7 11.5 - 14.5 %   Platelets 214 150 - 440 K/uL  Protime-INR - (order if Patient is taking Coumadin / Warfarin)     Status: None   Collection Time: 11/07/16 11:56 PM  Result Value Ref Range   Prothrombin Time 13.3 11.4 - 15.2 seconds   INR 1.01   TSH     Status: None   Collection Time: 11/07/16 11:56 PM  Result Value Ref Range   TSH 1.171 0.350 - 4.500 uIU/mL    Comment: Performed by a 3rd Generation assay with a functional sensitivity of <=0.01 uIU/mL.   Dg Chest 2 View  Result Date: 11/08/2016 CLINICAL DATA:  Coffee-ground emesis at 22:30. EXAM: CHEST  2 VIEW COMPARISON:  None. FINDINGS: The lungs are clear. The pulmonary  vasculature is normal. Heart size is normal. Hilar and mediastinal contours are unremarkable. There is no pleural effusion. IMPRESSION: No active cardiopulmonary disease. Electronically Signed   By: Andreas Newport M.D.   On: 11/08/2016 00:49    Review of Systems  Unable to perform ROS: Dementia  Gastrointestinal: Positive for constipation (intermittemently) and vomiting. Negative for blood in stool and melena.    Blood pressure 125/87, pulse (!) 101, temperature 98.7 F (37.1 C), temperature source Oral, resp. rate 20, height 6' 5"  (1.956 m), weight 73.8 kg (162 lb 12.8 oz), SpO2 95 %. Physical Exam  Constitutional: He appears well-developed and well-nourished. No distress.  HENT:  Head: Normocephalic and atraumatic.  Mouth/Throat: Oropharynx is clear and moist.  Eyes: Pupils are equal, round, and reactive to light. Conjunctivae and EOM are normal. No scleral icterus.  Neck: Normal range of motion. Neck supple. No JVD present. No tracheal deviation present. No thyromegaly present.  Cardiovascular: Normal rate, regular rhythm and normal heart sounds.  Exam reveals no gallop and no friction rub.   No murmur heard. Respiratory: Effort normal and breath sounds normal. No respiratory distress.  GI: Soft. Bowel sounds are normal. He exhibits no distension. There is no tenderness.  Genitourinary:  Genitourinary Comments: Deferred  Musculoskeletal: Normal range of motion. He exhibits no edema.  Lymphadenopathy:    He has no cervical adenopathy.  Neurological: He is alert. No cranial nerve deficit.  Skin: Skin is warm and dry. No rash noted. No erythema.  Psychiatric: He has a normal mood and affect. His behavior is normal. Judgment and thought content normal.     Assessment/Plan This is an 83 year old male admitted for hematemesis. 1. Hematemesis: Continue protonic strip. The patient is nothing by mouth for EGD. The patient is hemodynamically stable. Gastroenterology consulted. 2. GERD:  Possibly causing dysplasia which predispose the patient to unroofing of vessels within the esophagus. PPI as above. 3. Dementia: Continue Namenda 4. DVT prophylaxis: SCDs 5. GI prophylaxis: As above The patient is a DO NOT RESUSCITATE. Time spent on admission orders and patient care approximately 45 minutes  Harrie Foreman, MD 11/08/2016, 5:06 AM

## 2016-11-08 NOTE — Progress Notes (Signed)
[facility] [employer] - [place] at [facility]                                                                                                                                                                                  Patient Demographics   Joshua Vaughn, is a 83 y.o. male, DOB - 10/28/1933, VWU:[id number]  Admit date - 11/07/2016   Admitting Physician Arnaldo NatalMichael S Diamond, MD  Outpatient Primary MD for the patient is Danella PentonMiller, Mark F, MD   LOS - 0  Subjective: Patient with dementia admitted with hematemesis currently states that he has not had any further hematemesis    Review of Systems:   CONSTITUTIONAL: Limited due to his dementia  Vitals:   Vitals:   11/08/16 0230 11/08/16 0315 11/08/16 0318 11/08/16 0737  BP: 116/74 125/87  108/71  Pulse: (!) 103 (!) 101  63  Resp: (!) 23 20  18   Temp:  98.7 F (37.1 C)  98.2 F (36.8 C)  TempSrc:  Oral  Axillary  SpO2: 94% 95%  95%  Weight:   162 lb 12.8 oz (73.8 kg)   Height:   6\' 5"  (1.956 m)     Wt Readings from Last 3 Encounters:  11/08/16 162 lb 12.8 oz (73.8 kg)  03/10/16 172 lb (78 kg)     Intake/Output Summary (Last 24 hours) at 11/08/16 1408 Last data filed at 11/08/16 0400  Gross per 24 hour  Intake           192.92 ml  Output                0 ml  Net           192.92 ml    Physical Exam:   GENERAL: Pleasant-appearing in no apparent distress.  HEAD, EYES, EARS, NOSE AND THROAT: Atraumatic, normocephalic. Extraocular muscles are intact. Pupils equal and reactive to light. Sclerae anicteric. No conjunctival injection. No oro-pharyngeal erythema.  NECK: Supple. There is no jugular venous distention. No bruits, no lymphadenopathy, no thyromegaly.  HEART: Regular rate and rhythm,. No murmurs, no rubs, no clicks.  LUNGS: Clear to auscultation bilaterally. No rales or rhonchi. No wheezes.  ABDOMEN: Soft, flat, nontender, nondistended. Has good bowel sounds. No hepatosplenomegaly appreciated.  EXTREMITIES: No  evidence of any cyanosis, clubbing, or peripheral edema.  +2 pedal and radial pulses bilaterally.  NEUROLOGIC: The patient is alert, awake, and oriented x3 with no focal motor or sensory deficits appreciated bilaterally.  SKIN: Moist and warm with no rashes appreciated.  Psych: Not anxious, depressed LN: No inguinal LN enlargement    Antibiotics   Anti-infectives    None  Medications   Scheduled Meds: . chlorhexidine  15 mL Mouth Rinse BID  . docusate sodium  100 mg Oral BID  . mouth rinse  15 mL Mouth Rinse q12n4p  . memantine  10 mg Oral BID  . [START ON 11/11/2016] pantoprazole  40 mg Intravenous Q12H   Continuous Infusions: . sodium chloride 125 mL/hr at 11/08/16 1118  . pantoprozole (PROTONIX) infusion 8 mg/hr (11/08/16 1402)   PRN Meds:.acetaminophen **OR** acetaminophen, lactulose, ondansetron **OR** ondansetron (ZOFRAN) IV   Data Review:   Micro Results No results found for this or any previous visit (from the past 240 hour(s)).  Radiology Reports Dg Chest 2 View  Result Date: 11/08/2016 CLINICAL DATA:  Coffee-ground emesis at 22:30. EXAM: CHEST  2 VIEW COMPARISON:  None. FINDINGS: The lungs are clear. The pulmonary vasculature is normal. Heart size is normal. Hilar and mediastinal contours are unremarkable. There is no pleural effusion. IMPRESSION: No active cardiopulmonary disease. Electronically Signed   By: Ellery Plunkaniel R Mitchell M.D.   On: 11/08/2016 00:49     CBC  Recent Labs Lab 11/07/16 2356  WBC 13.6*  HGB 17.0  HCT 50.2  PLT 214  MCV 93.8  MCH 31.8  MCHC 33.9  RDW 13.7    Chemistries   Recent Labs Lab 11/07/16 2356  NA 135  K 4.1  CL 100*  CO2 27  GLUCOSE 144*  BUN 14  CREATININE 0.86  CALCIUM 9.5  AST 29  ALT 20  ALKPHOS 60  BILITOT 1.8*   ------------------------------------------------------------------------------------------------------------------ estimated creatinine clearance is 71.5 mL/min (by C-G formula based on SCr  of 0.86 mg/dL). ------------------------------------------------------------------------------------------------------------------ No results for input(s): HGBA1C in the last 72 hours. ------------------------------------------------------------------------------------------------------------------ No results for input(s): CHOL, HDL, LDLCALC, TRIG, CHOLHDL, LDLDIRECT in the last 72 hours. ------------------------------------------------------------------------------------------------------------------  Recent Labs  11/07/16 2356  TSH 1.171   ------------------------------------------------------------------------------------------------------------------ No results for input(s): VITAMINB12, FOLATE, FERRITIN, TIBC, IRON, RETICCTPCT in the last 72 hours.  Coagulation profile  Recent Labs Lab 11/07/16 2356  INR 1.01    No results for input(s): DDIMER in the last 72 hours.  Cardiac Enzymes No results for input(s): CKMB, TROPONINI, MYOGLOBIN in the last 168 hours.  Invalid input(s): CK ------------------------------------------------------------------------------------------------------------------ Invalid input(s): POCBNP    Assessment & Plan   This is an 83 year old male admitted for hematemesis. 1. Hematemesis: Continue protonic Drip. The patient is nothing by mouth for EGD. The patient is hemodynamically stable. GI consult pending. 2. GERD: Continue PPIs as above 3. Dementia: Continue Namenda 4. DVT prophylaxis: SCDs 5. GI prophylaxis: As above The patient is a DO NOT RESUSCITATE.         Code Status Orders        Start     Ordered   11/08/16 0324  Do not attempt resuscitation (DNR)  Continuous    Question Answer Comment  In the event of cardiac or respiratory ARREST Do not call a "code blue"   In the event of cardiac or respiratory ARREST Do not perform Intubation, CPR, defibrillation or ACLS   In the event of cardiac or respiratory ARREST Use medication by  any route, position, wound care, and other measures to relive pain and suffering. May use oxygen, suction and manual treatment of airway obstruction as needed for comfort.      11/08/16 0323    Code Status History    Date Active Date Inactive Code Status Order ID Comments User Context   03/08/2016  7:27 PM 03/12/2016  3:56 PM DNR [id number]  Gouru,  Deanna Artis, MD Inpatient   03/08/2016  6:32 PM 03/08/2016  7:27 PM Full Code [id number]  Ramonita Lab, MD ED    Advance Directive Documentation     Most Recent Value  Type of Advance Directive  Living will, Healthcare Power of Attorney  Pre-existing out of facility DNR order (yellow form or pink MOST form)  -  "MOST" Form in Place?  -           Consults  Gastroenterology   DVT Prophylaxis  SCDs  Lab Results  Component Value Date   PLT 214 11/07/2016     Time Spent in minutes   Greater than 50% of time spent in care coordination and counseling patient regarding the condition and plan of care.   Auburn Bilberry M.D on 11/08/2016 at 2:08 PM  Between 7am to 6pm - Pager - [phone]  After 6pm go to [email or web address] - password [email or web address] [facility]  [facility] [place] [facility]   Office  [phone]

## 2016-11-08 NOTE — ED Provider Notes (Signed)
[facility] Emergency Department Provider Note   First MD Initiated Contact with Patient 11/07/16 2352     (approximate)  I have reviewed the triage vital signs and the nursing notes.  Level/caveat: Dementia HISTORY  Chief Complaint Hematemesis    HPI Joshua Vaughn is a 83 y.o. male with belows chronic medical conditions including previous gastric ulcer with upper GI bleed presents to the emergency department following a "large episode of coffee ground vomiting 1". Patient's wife at bedside stated that the patient had 2 episodes of vomiting and then subsequently had an episode of coffee-ground emesis which she describes as a large amount.   Past Medical History:  Diagnosis Date  . Dementia   . GERD (gastroesophageal reflux disease)     Patient Active Problem List   Diagnosis Date Noted  . Hematemesis 11/08/2016  . Coffee ground emesis 03/08/2016    Past Surgical History:  Procedure Laterality Date  . ESOPHAGOGASTRODUODENOSCOPY (EGD) WITH PROPOFOL N/A 03/10/2016   Procedure: ESOPHAGOGASTRODUODENOSCOPY (EGD) WITH PROPOFOL;  Surgeon: Scot Jun, MD;  Location: [facility] ENDOSCOPY;  Service: Endoscopy;  Laterality: N/A;    Prior to Admission medications   Medication Sig Start Date End Date Taking? Authorizing Provider  docusate sodium (COLACE) 100 MG capsule Take 1 capsule (100 mg total) by mouth 2 (two) times daily. 03/12/16  Yes Wieting, Richard, MD  memantine (NAMENDA) 10 MG tablet 10 mg 2 (two) times daily.  03/02/16  Yes [provider]    Allergies No known drug allergies No family history on file.  Social History Social History  Substance Use Topics  . Smoking status: Never Smoker  . Smokeless tobacco: Never Used  . Alcohol use No    Review of Systems Constitutional: No fever/chills Eyes: No visual changes. ENT: No sore throat. Cardiovascular: Denies chest pain. Respiratory: Denies shortness of  breath. Gastrointestinal: No abdominal pain.  No nausea, no vomiting.  No diarrhea.  No constipation.Positive coffee-ground emesis Genitourinary: Negative for dysuria. Musculoskeletal: Negative for neck pain.  Negative for back pain. Integumentary: Negative for rash. Neurological: Negative for headaches, focal weakness or numbness.  ____________________________________________   PHYSICAL EXAM:  VITAL SIGNS: ED Triage Vitals  Enc Vitals Group     BP 11/07/16 2358 117/77     Pulse Rate 11/07/16 2358 (!) 108     Resp 11/07/16 2358 17     Temp 11/07/16 2358 99.2 F (37.3 C)     Temp Source 11/07/16 2358 Axillary     SpO2 11/07/16 2358 91 %     Weight 11/07/16 2352 86.2 kg (190 lb)     Height --      Head Circumference --      Peak Flow --      Pain Score --      Pain Loc --      Pain Edu? --      Excl. in GC? --     Constitutional: Alert and  Well appearing and in no acute distress. Eyes: Conjunctivae are normal.  Head: Atraumatic. Nose: No congestion/rhinnorhea. Mouth/Throat: Mucous membranes are moist.Coffee-ground emesis noted on the motor vehicle mucosa and sublingual Neck: No stridor.   Cardiovascular: Normal rate, regular rhythm. Good peripheral circulation. Grossly normal heart sounds. Respiratory: Normal respiratory effort.  No retractions. Lungs CTAB. Gastrointestinal: Soft and nontender. No distention.  Musculoskeletal: No lower extremity tenderness nor edema. No gross deformities of extremities. Neurologic:  Normal speech and language. No gross focal neurologic deficits are appreciated.  Skin:  Skin is warm, dry and intact. No rash noted. Psychiatric: Mood and affect are normal. Speech and behavior are normal.  ____________________________________________   LABS (all labs ordered are listed, but only abnormal results are displayed)  Labs Reviewed  COMPREHENSIVE METABOLIC PANEL - Abnormal; Notable for the following:       Result Value   Chloride 100 (*)     Glucose, Bld 144 (*)    Total Bilirubin 1.8 (*)    All other components within normal limits  CBC - Abnormal; Notable for the following:    WBC 13.6 (*)    All other components within normal limits  PROTIME-INR  TSH  HEMOGLOBIN A1C  TYPE AND SCREEN   ____________________________________________  EKG  ED ECG REPORT I, [place] N Chanele Douglas, the attending physician, personally viewed and interpreted this ECG.   Date: 11/08/2016  EKG Time: 12:08 AM  Rate: 107  Rhythm: Atrial fibrillation with rapid ventricular response right bundle-branch block  Axis: Normal  Intervals: Normal  ST&T Change: None  ____________________________________________  RADIOLOGY I, [place] N Omayra Tulloch, personally viewed and evaluated these images (plain radiographs) as part of my medical decision making, as well as reviewing the written report by the radiologist.  Dg Chest 2 View  Result Date: 11/08/2016 CLINICAL DATA:  Coffee-ground emesis at 22:30. EXAM: CHEST  2 VIEW COMPARISON:  None. FINDINGS: The lungs are clear. The pulmonary vasculature is normal. Heart size is normal. Hilar and mediastinal contours are unremarkable. There is no pleural effusion. IMPRESSION: No active cardiopulmonary disease. Electronically Signed   By: Ellery Plunkaniel R Mitchell M.D.   On: 11/08/2016 00:49     Procedures   ____________________________________________   INITIAL IMPRESSION / ASSESSMENT AND PLAN / ED COURSE  Pertinent labs & imaging results that were available during my care of the patient were reviewed by me and considered in my medical decision making (see chart for details).  83 year old male presents to the emergency department history physical exam consistent with upper GI bleed. Patient given IV Protonix bolus and drip. Octreotide not given as there is no history of esophageal varices.     ____________________________________________  FINAL CLINICAL IMPRESSION(S) / ED DIAGNOSES  Upper gastrointestinal  bleeding  MEDICATIONS GIVEN DURING THIS VISIT:  Medications  pantoprazole (PROTONIX) 80 mg in sodium chloride 0.9 % 250 mL (0.32 mg/mL) infusion (8 mg/hr Intravenous Transfusing/Transfer 11/08/16 0231)  pantoprazole (PROTONIX) injection 40 mg (not administered)  memantine (NAMENDA) tablet 10 mg (10 mg Oral Not Given 11/08/16 0343)  docusate sodium (COLACE) capsule 100 mg (100 mg Oral Not Given 11/08/16 0344)  lactulose (CHRONULAC) 10 GM/15ML solution 20 g (not administered)  0.9 %  sodium chloride infusion ( Intravenous New Bag/Given 11/08/16 0342)  acetaminophen (TYLENOL) tablet 650 mg (not administered)    Or  acetaminophen (TYLENOL) suppository 650 mg (not administered)  ondansetron (ZOFRAN) tablet 4 mg (not administered)    Or  ondansetron (ZOFRAN) injection 4 mg (not administered)  chlorhexidine (PERIDEX) 0.12 % solution 15 mL (not administered)  MEDLINE mouth rinse (not administered)  pantoprazole (PROTONIX) 80 mg in sodium chloride 0.9 % 100 mL IVPB (0 mg Intravenous Stopped 11/08/16 0204)     NEW OUTPATIENT MEDICATIONS STARTED DURING THIS VISIT:  Current Discharge Medication List      Current Discharge Medication List      Current Discharge Medication List    STOP taking these medications     lactulose (CHRONULAC) 10 GM/15ML solution Comments:  Reason for Stopping:  Note:  This document was prepared using Dragon voice recognition software and may include unintentional dictation errors.    Darci CurrentBrown, [place] N, MD 11/08/16 [phone]

## 2016-11-08 NOTE — ED Notes (Signed)
[facility]

## 2016-11-09 ENCOUNTER — Encounter: Payer: Self-pay | Admitting: Gastroenterology

## 2016-11-09 DIAGNOSIS — K920 Hematemesis: Secondary | ICD-10-CM | POA: Diagnosis not present

## 2016-11-09 DIAGNOSIS — K210 Gastro-esophageal reflux disease with esophagitis: Secondary | ICD-10-CM | POA: Diagnosis not present

## 2016-11-09 LAB — CBC WITH DIFFERENTIAL/PLATELET
Basophils Absolute: 0 10*3/uL (ref 0–0.1)
Basophils Relative: 1 %
EOS PCT: 2 %
Eosinophils Absolute: 0.1 10*3/uL (ref 0–0.7)
HCT: 38.1 % — ABNORMAL LOW (ref 40.0–52.0)
HEMOGLOBIN: 12.9 g/dL — AB (ref 13.0–18.0)
LYMPHS ABS: 2.1 10*3/uL (ref 1.0–3.6)
Lymphocytes Relative: 30 %
MCH: 31.8 pg (ref 26.0–34.0)
MCHC: 33.9 g/dL (ref 32.0–36.0)
MCV: 93.7 fL (ref 80.0–100.0)
MONO ABS: 0.6 10*3/uL (ref 0.2–1.0)
MONOS PCT: 8 %
NEUTROS PCT: 59 %
Neutro Abs: 4.2 10*3/uL (ref 1.4–6.5)
Platelets: 166 10*3/uL (ref 150–440)
RBC: 4.06 MIL/uL — ABNORMAL LOW (ref 4.40–5.90)
RDW: 13.8 % (ref 11.5–14.5)
WBC: 7.1 10*3/uL (ref 3.8–10.6)

## 2016-11-09 LAB — HEMOGLOBIN A1C
Hgb A1c MFr Bld: 5.6 % (ref 4.8–5.6)
Mean Plasma Glucose: 114 mg/dL

## 2016-11-09 NOTE — Progress Notes (Signed)
[employer] [facility] [place] [facility]                                                                                                                                                                                  Patient Demographics   Joshua Vaughn, is a 83 y.o. male, DOB - 06/09/1933, UJW:[id number]  Admit date - 11/07/2016   Admitting Physician Arnaldo NatalMichael S Diamond, MD  Outpatient Primary MD for the patient is Danella PentonMiller, Mark F, MD   LOS - 1  Subjective: Patient with dementia he denies any complaints nurse states that he had not had any further hematemesis   Review of Systems:   CONSTITUTIONAL: Limited due to his dementia  Vitals:   Vitals:   11/08/16 1441 11/08/16 2226 11/09/16 0500 11/09/16 0737  BP: 109/90 122/63  113/70  Pulse: 81 81  69  Resp:  18  18  Temp: 98.6 F (37 C) 98.6 F (37 C)  (!) 97.4 F (36.3 C)  TempSrc: Axillary Oral  Oral  SpO2: 93% 93%  94%  Weight:   168 lb (76.2 kg)   Height:        Wt Readings from Last 3 Encounters:  11/09/16 168 lb (76.2 kg)  03/10/16 172 lb (78 kg)     Intake/Output Summary (Last 24 hours) at 11/09/16 1149 Last data filed at 11/09/16 0000  Gross per 24 hour  Intake             2850 ml  Output                0 ml  Net             2850 ml    Physical Exam:   GENERAL: Pleasant-appearing in no apparent distress.  HEAD, EYES, EARS, NOSE AND THROAT: Atraumatic, normocephalic. Extraocular muscles are intact. Pupils equal and reactive to light. Sclerae anicteric. No conjunctival injection. No oro-pharyngeal erythema.  NECK: Supple. There is no jugular venous distention. No bruits, no lymphadenopathy, no thyromegaly.  HEART: Regular rate and rhythm,. No murmurs, no rubs, no clicks.  LUNGS: Clear to auscultation bilaterally. No rales or rhonchi. No wheezes.  ABDOMEN: Soft, flat, nontender, nondistended. Has good bowel sounds. No hepatosplenomegaly appreciated.  EXTREMITIES: No evidence of any cyanosis, clubbing,  or peripheral edema.  +2 pedal and radial pulses bilaterally.  NEUROLOGIC: The patient is alert, awake, and oriented x3 with no focal motor or sensory deficits appreciated bilaterally.  SKIN: Moist and warm with no rashes appreciated.  Psych: Not anxious, depressed LN: No inguinal LN enlargement    Antibiotics   Anti-infectives    None      Medications  Scheduled Meds: . chlorhexidine  15 mL Mouth Rinse BID  . docusate sodium  100 mg Oral BID  . mouth rinse  15 mL Mouth Rinse q12n4p  . memantine  10 mg Oral BID  . [START ON 11/11/2016] pantoprazole  40 mg Intravenous Q12H   Continuous Infusions:  PRN Meds:.acetaminophen **OR** acetaminophen, lactulose, ondansetron **OR** ondansetron (ZOFRAN) IV   Data Review:   Micro Results No results found for this or any previous visit (from the past 240 hour(s)).  Radiology Reports Dg Chest 2 View  Result Date: 11/08/2016 CLINICAL DATA:  Coffee-ground emesis at 22:30. EXAM: CHEST  2 VIEW COMPARISON:  None. FINDINGS: The lungs are clear. The pulmonary vasculature is normal. Heart size is normal. Hilar and mediastinal contours are unremarkable. There is no pleural effusion. IMPRESSION: No active cardiopulmonary disease. Electronically Signed   By: Ellery Plunkaniel R Mitchell M.D.   On: 11/08/2016 00:49     CBC  Recent Labs Lab 11/07/16 2356 11/09/16 0915  WBC 13.6* 7.1  HGB 17.0 12.9*  HCT 50.2 38.1*  PLT 214 166  MCV 93.8 93.7  MCH 31.8 31.8  MCHC 33.9 33.9  RDW 13.7 13.8  LYMPHSABS  --  2.1  MONOABS  --  0.6  EOSABS  --  0.1  BASOSABS  --  0.0    Chemistries   Recent Labs Lab 11/07/16 2356  NA 135  K 4.1  CL 100*  CO2 27  GLUCOSE 144*  BUN 14  CREATININE 0.86  CALCIUM 9.5  AST 29  ALT 20  ALKPHOS 60  BILITOT 1.8*   ------------------------------------------------------------------------------------------------------------------ estimated creatinine clearance is 73.8 mL/min (by C-G formula based on SCr of 0.86  mg/dL). ------------------------------------------------------------------------------------------------------------------  Recent Labs  11/07/16 2356  HGBA1C 5.6   ------------------------------------------------------------------------------------------------------------------ No results for input(s): CHOL, HDL, LDLCALC, TRIG, CHOLHDL, LDLDIRECT in the last 72 hours. ------------------------------------------------------------------------------------------------------------------  Recent Labs  11/07/16 2356  TSH 1.171   ------------------------------------------------------------------------------------------------------------------ No results for input(s): VITAMINB12, FOLATE, FERRITIN, TIBC, IRON, RETICCTPCT in the last 72 hours.  Coagulation profile  Recent Labs Lab 11/07/16 2356  INR 1.01    No results for input(s): DDIMER in the last 72 hours.  Cardiac Enzymes No results for input(s): CKMB, TROPONINI, MYOGLOBIN in the last 168 hours.  Invalid input(s): CK ------------------------------------------------------------------------------------------------------------------ Invalid input(s): POCBNP    Assessment & Plan   This is an 83 year old male admitted for hematemesis. 1. Hematemesis: Hemoglobin stable. I discussed with GI on call. Patient's wife states that she would like him to have the endoscopy. She is concerned about the bleeding. GI will see the patient and decide whether endoscopy tomorrow as necessary and will give call to the wife to explain if they are not planning to do the endoscopy. I will change Protonix to twice a day IV discontinue the drip 2. GERD: Continue PPIs as above 3. Dementia: Continue Namenda 4. DVT prophylaxis: SCDs 5. GI prophylaxis: As above The patient is a DO NOT RESUSCITATE.         Code Status Orders        Start     Ordered   11/08/16 0324  Do not attempt resuscitation (DNR)  Continuous    Question Answer Comment  In  the event of cardiac or respiratory ARREST Do not call a "code blue"   In the event of cardiac or respiratory ARREST Do not perform Intubation, CPR, defibrillation or ACLS   In the event of cardiac or respiratory ARREST Use medication by any route, position, wound care,  and other measures to relive pain and suffering. May use oxygen, suction and manual treatment of airway obstruction as needed for comfort.      11/08/16 0323    Code Status History    Date Active Date Inactive Code Status Order ID Comments User Context   03/08/2016  7:27 PM 03/12/2016  3:56 PM DNR [id number]  Ramonita Lab, MD Inpatient   03/08/2016  6:32 PM 03/08/2016  7:27 PM Full Code [id number]  Ramonita Lab, MD ED    Advance Directive Documentation     Most Recent Value  Type of Advance Directive  Living will, Healthcare Power of Attorney  Pre-existing out of facility DNR order (yellow form or pink MOST form)  -  "MOST" Form in Place?  -      I discussed and updated the condition to patient's wife     Consults  Gastroenterology   DVT Prophylaxis  SCDs  Lab Results  Component Value Date   PLT 166 11/09/2016     Time Spent in minutes   35 minutes Greater than 50% of time spent in care coordination and counseling patient regarding the condition and plan of care.   Auburn Bilberry M.D on 11/09/2016 at 11:49 AM  Between 7am to 6pm - Pager - [phone]  After 6pm go to [email or web address] - password [email or web address] [facility]  [facility]   Office  [phone]

## 2016-11-09 NOTE — Consult Note (Signed)
Reason for Consult: Hematemesis Referring Physician: Dr. Dala Dock Joshua Vaughn is an 83 y.o. male.  HPI: Seen in consultation at the request of Dr. Posey Pronto for further evaluation of hematemesis. The history is obtained through the patient's wife, my telephone conversation with Dr. Posey Pronto, and review of the electronic medical record. The patient has baseline dementia and is unable to offer any meaningful input.  Admitted through the ER yesterday after the patient developed the acute onset of nausea and billous vomiting, awaking his wife from sleep with his symptoms around 4am. With the third episode of emesis, she noted coffee grounds and brought him to the ER. On arrival his hemoglobin was 17. This morning it fell to 12.9. He has had no further hematemesis. No melena or hematochezia. No other associated symptoms. No identified exacerbating or relieving features.  Review of EPIC shows a hgb of 12.4 03/11/16.  His wife, who is his primary care giver as he is unable to perform any ADLs independently, is concerned that he has had additional bleeding from known AVMs. An EGD performed by Dr. 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 He stopped using all NSAIDs in 2017 as recommended by Dr. Vira Agar. The wife is very persistent that the patient have another EGD because of this concern.     Past Medical History:  Diagnosis Date  . Dementia   . GERD (gastroesophageal reflux disease)     Past Surgical History:  Procedure Laterality Date  . ESOPHAGOGASTRODUODENOSCOPY (EGD) WITH PROPOFOL N/A 03/10/2016   Procedure: ESOPHAGOGASTRODUODENOSCOPY (EGD) WITH PROPOFOL;  Surgeon: Manya Silvas, MD;  Location: [facility] ENDOSCOPY;  Service: Endoscopy;  Laterality: N/A;    History reviewed. No pertinent family history.  Social History:  reports that he has never smoked. He has never used smokeless tobacco. He reports  that he does not drink alcohol or use drugs.  Allergies: No Known Allergies  Medications:  I have reviewed the patient's current medications. Prior to Admission:  Prescriptions Prior to Admission  Medication Sig Dispense Refill Last Dose  . docusate sodium (COLACE) 100 MG capsule Take 1 capsule (100 mg total) by mouth 2 (two) times daily. 60 capsule 0 11/07/2016 at Unknown time  . memantine (NAMENDA) 10 MG tablet 10 mg 2 (two) times daily.    11/07/2016 at Unknown time   Scheduled: . chlorhexidine  15 mL Mouth Rinse BID  . docusate sodium  100 mg Oral BID  . mouth rinse  15 mL Mouth Rinse q12n4p  . memantine  10 mg Oral BID  . [START ON 11/11/2016] pantoprazole  40 mg Intravenous Q12H   Continuous:  KGY:[id number] **OR** acetaminophen, lactulose, ondansetron **OR** ondansetron (ZOFRAN) IV  Results for orders placed or performed during the hospital encounter of 11/07/16 (from the past 48 hour(s))  Comprehensive metabolic panel     Status: Abnormal   Collection Time: 11/07/16 11:56 PM  Result Value Ref Range   Sodium 135 135 - 145 mmol/L   Potassium 4.1 3.5 - 5.1 mmol/L   Chloride 100 (L) 101 - 111 mmol/L   CO2 27 22 - 32 mmol/L   Glucose, Bld 144 (H) 65 - 99 mg/dL   BUN 14 6 - 20 mg/dL   Creatinine, Ser 0.86 0.61 - 1.24 mg/dL   Calcium 9.5 8.9 - 10.3 mg/dL   Total Protein 7.9 6.5 - 8.1 g/dL   Albumin 4.0 3.5 - 5.0 g/dL  AST 29 15 - 41 U/L   ALT 20 17 - 63 U/L   Alkaline Phosphatase 60 38 - 126 U/L   Total Bilirubin 1.8 (H) 0.3 - 1.2 mg/dL   GFR calc non Af Amer >60 >60 mL/min   GFR calc Af Amer >60 >60 mL/min    Comment: (NOTE) The eGFR has been calculated using the CKD EPI equation. This calculation has not been validated in all clinical situations. eGFR's persistently <60 mL/min signify possible Chronic Kidney Disease.    Anion gap 8 5 - 15  CBC     Status: Abnormal   Collection Time: 11/07/16 11:56 PM  Result Value Ref Range   WBC 13.6 (H) 3.8 - 10.6 K/uL    RBC 5.35 4.40 - 5.90 MIL/uL   Hemoglobin 17.0 13.0 - 18.0 g/dL   HCT 50.2 40.0 - 52.0 %   MCV 93.8 80.0 - 100.0 fL   MCH 31.8 26.0 - 34.0 pg   MCHC 33.9 32.0 - 36.0 g/dL   RDW 13.7 11.5 - 14.5 %   Platelets 214 150 - 440 K/uL  Protime-INR - (order if Patient is taking Coumadin / Warfarin)     Status: None   Collection Time: 11/07/16 11:56 PM  Result Value Ref Range   Prothrombin Time 13.3 11.4 - 15.2 seconds   INR 1.01   TSH     Status: None   Collection Time: 11/07/16 11:56 PM  Result Value Ref Range   TSH 1.171 0.350 - 4.500 uIU/mL    Comment: Performed by a 3rd Generation assay with a functional sensitivity of <=0.01 uIU/mL.  Hemoglobin A1c     Status: None   Collection Time: 11/07/16 11:56 PM  Result Value Ref Range   Hgb A1c MFr Bld 5.6 4.8 - 5.6 %    Comment: (NOTE)         Pre-diabetes: 5.7 - 6.4         Diabetes: >6.4         Glycemic control for adults with diabetes: <7.0    Mean Plasma Glucose 114 mg/dL    Comment: (NOTE) Performed At: [facility] [place], [place] [id number] Lindon Romp MD NW:[id number]   CBC with Differential/Platelet     Status: Abnormal   Collection Time: 11/09/16  9:15 AM  Result Value Ref Range   WBC 7.1 3.8 - 10.6 K/uL   RBC 4.06 (L) 4.40 - 5.90 MIL/uL   Hemoglobin 12.9 (L) 13.0 - 18.0 g/dL   HCT 38.1 (L) 40.0 - 52.0 %   MCV 93.7 80.0 - 100.0 fL   MCH 31.8 26.0 - 34.0 pg   MCHC 33.9 32.0 - 36.0 g/dL   RDW 13.8 11.5 - 14.5 %   Platelets 166 150 - 440 K/uL   Neutrophils Relative % 59 %   Neutro Abs 4.2 1.4 - 6.5 K/uL   Lymphocytes Relative 30 %   Lymphs Abs 2.1 1.0 - 3.6 K/uL   Monocytes Relative 8 %   Monocytes Absolute 0.6 0.2 - 1.0 K/uL   Eosinophils Relative 2 %   Eosinophils Absolute 0.1 0 - 0.7 K/uL   Basophils Relative 1 %   Basophils Absolute 0.0 0 - 0.1 K/uL    Dg Chest 2 View  Result Date: 11/08/2016 CLINICAL DATA:  Coffee-ground emesis at 22:30. EXAM: CHEST  2 VIEW COMPARISON:  None.  FINDINGS: The lungs are clear. The pulmonary vasculature is normal. Heart size is normal. Hilar and mediastinal contours  are unremarkable. There is no pleural effusion. IMPRESSION: No active cardiopulmonary disease. Electronically Signed   By: Andreas Newport M.D.   On: 11/08/2016 00:49    Review of Systems  Unable to perform ROS: Dementia   Blood pressure (!) 110/55, pulse 82, temperature 98.1 F (36.7 C), temperature source Axillary, resp. rate 18, height _0  (1.956 m), weight 168 lb (76.2 kg), SpO2 94 %. Physical Exam  Constitutional: He appears well-developed and well-nourished. No distress.  Elderly appearing  HENT:  Head: Normocephalic and atraumatic.  Eyes: Conjunctivae are normal. No scleral icterus.  Neck: Neck supple. No thyromegaly present.  Cardiovascular: Normal rate and regular rhythm.   Respiratory: Effort normal and breath sounds normal.  GI: Soft. Bowel sounds are normal. He exhibits no distension and no mass. There is no tenderness. There is no rebound and no guarding.  Musculoskeletal: Normal range of motion. He exhibits no edema.  Neurological: He is alert.  Disoriented. At baseline per his wife's report.  Skin: Skin is dry. No rash noted.  Psychiatric: He has a normal mood and affect. Thought content normal.    Assessment/Plan: Hematemesis Possible GI blood loss anemia Normal BUN Gastric AVMs treated with APC and endoclip by Dr. Vira Agar 03/10/16 Advanced dementia  Recent episode of hematemesis occurring without other signs or symptoms of GI blood loss anemia. Baseline hemoglobin is unknown. Last hemoglobin available to me was obtained during a time of evaluation and treatment of AVMs.   Our options would be continued observation with further investigation with evidence for overt GI blood loss or progressive anemia versus proceeding with endoscopy now. His wife strongly favors proceeding now, even after we discussed the possible risks. Therefore, will plan  for EGD with Dr. Vicente Males 11/10/16.   The patient's wife was consented at the bedside. We discussed the risks, benefits, and alternatives to endoscopic evaluation. In particular, we discussed the risks that include, but are not limited to, reaction to medication, cardiopulmonary compromise, bleeding requiring blood transfusion, aspiration resulting in pneumonia, perforation requiring surgery, and even death. She acknowledges these risks and asks that we proceed. I have also asked her to begin considering goals of care.   Thank you for allowing me to participate in Mr. [place] care. Please call with any additional questions or concerns.  [place] 11/09/2016, 3:11 PM

## 2016-11-10 ENCOUNTER — Encounter
Admission: EM | Disposition: A | Discharge status: Skilled Nursing Facility | Payer: Self-pay | Source: Home / Self Care | Attending: Internal Medicine

## 2016-11-10 ENCOUNTER — Encounter: Payer: Self-pay | Admitting: *Deleted

## 2016-11-10 ENCOUNTER — Inpatient Hospital Stay: Payer: Medicare Other | Admitting: Anesthesiology

## 2016-11-10 DIAGNOSIS — K210 Gastro-esophageal reflux disease with esophagitis: Secondary | ICD-10-CM | POA: Diagnosis not present

## 2016-11-10 DIAGNOSIS — K920 Hematemesis: Secondary | ICD-10-CM

## 2016-11-10 DIAGNOSIS — K449 Diaphragmatic hernia without obstruction or gangrene: Secondary | ICD-10-CM | POA: Diagnosis not present

## 2016-11-10 DIAGNOSIS — K209 Esophagitis, unspecified: Secondary | ICD-10-CM

## 2016-11-10 HISTORY — PX: ESOPHAGOGASTRODUODENOSCOPY: SHX5428

## 2016-11-10 LAB — BASIC METABOLIC PANEL
ANION GAP: 4 — AB (ref 5–15)
BUN: 8 mg/dL (ref 6–20)
CHLORIDE: 109 mmol/L (ref 101–111)
CO2: 24 mmol/L (ref 22–32)
Calcium: 8.3 mg/dL — ABNORMAL LOW (ref 8.9–10.3)
Creatinine, Ser: 0.73 mg/dL (ref 0.61–1.24)
GFR calc non Af Amer: >60 mL/min (ref >60)
Glucose, Bld: 93 mg/dL (ref 65–99)
Potassium: 3.1 mmol/L — ABNORMAL LOW (ref 3.5–5.1)
Sodium: 137 mmol/L (ref 135–145)

## 2016-11-10 LAB — CBC
HEMATOCRIT: 37.9 % — AB (ref 40.0–52.0)
HEMOGLOBIN: 12.8 g/dL — AB (ref 13.0–18.0)
MCH: 31.5 pg (ref 26.0–34.0)
MCHC: 33.9 g/dL (ref 32.0–36.0)
MCV: 92.8 fL (ref 80.0–100.0)
Platelets: 169 10*3/uL (ref 150–440)
RBC: 4.08 MIL/uL — ABNORMAL LOW (ref 4.40–5.90)
RDW: 14 % (ref 11.5–14.5)
WBC: 7.1 10*3/uL (ref 3.8–10.6)

## 2016-11-10 SURGERY — EGD (ESOPHAGOGASTRODUODENOSCOPY)
Anesthesia: General | Laterality: Left

## 2016-11-10 SURGERY — EGD (ESOPHAGOGASTRODUODENOSCOPY)
Anesthesia: Monitor Anesthesia Care | Laterality: Left

## 2016-11-10 MED ORDER — PROPOFOL 500 MG/50ML IV EMUL
INTRAVENOUS | Status: DC | PRN
  Administered 2016-11-10: 100 ug/kg/min via INTRAVENOUS

## 2016-11-10 MED ORDER — PROPOFOL 500 MG/50ML IV EMUL
INTRAVENOUS | Status: AC
  Filled 2016-11-10: qty 50

## 2016-11-10 MED ORDER — PANTOPRAZOLE SODIUM 40 MG PO TBEC
40.0000 mg | DELAYED_RELEASE_TABLET | Freq: Two times a day (BID) | ORAL | Status: DC
  Administered 2016-11-10 – 2016-11-12 (×4): 40 mg via ORAL
  Filled 2016-11-10 (×4): qty 1

## 2016-11-10 MED ORDER — PROPOFOL 10 MG/ML IV BOLUS
INTRAVENOUS | Status: DC | PRN
  Administered 2016-11-10: 30 mg via INTRAVENOUS
  Administered 2016-11-10: 10 mg via INTRAVENOUS

## 2016-11-10 MED ORDER — SODIUM CHLORIDE 0.9 % IV SOLN
INTRAVENOUS | Status: DC
  Administered 2016-11-10: 08:00:00 via INTRAVENOUS

## 2016-11-10 MED ORDER — PROPOFOL 10 MG/ML IV BOLUS
INTRAVENOUS | Status: AC
  Filled 2016-11-10: qty 20

## 2016-11-10 NOTE — Anesthesia Procedure Notes (Signed)
Date/Time: 11/10/2016 8:53 AM Performed by: Marlana SalvageJESSUP, Tia Gelb Pre-anesthesia Checklist: Patient identified, Emergency Drugs available, Patient being monitored, Suction available and Timeout performed Patient Re-evaluated:Patient Re-evaluated prior to induction Oxygen Delivery Method: Nasal cannula

## 2016-11-10 NOTE — Transfer of Care (Signed)
Immediate Anesthesia Transfer of Care Note  Patient: Joshua FellersJoe P Swett  Procedure(s) Performed: Procedure(s): ESOPHAGOGASTRODUODENOSCOPY (EGD) (Left)  Patient Location: PACU  Anesthesia Type:General  Level of Consciousness: drowsy  Airway & Oxygen Therapy: Patient Spontanous Breathing and Patient connected to nasal cannula oxygen  Post-op Assessment: Report given to RN and Post -op Vital signs reviewed and stable  Post vital signs: Reviewed and stable  Last Vitals:  Vitals:   11/10/16 0748 11/10/16 0907  BP: 117/69 108/63  Pulse: 64 80  Resp: 18 18  Temp: 36.7 C (!) 36.1 C    Last Pain:  Vitals:   11/10/16 0907  TempSrc: Tympanic         Complications: No apparent anesthesia complications

## 2016-11-10 NOTE — Anesthesia Postprocedure Evaluation (Signed)
Anesthesia Post Note  Patient: Joshua Vaughn  Procedure(s) Performed: Procedure(s) (LRB): ESOPHAGOGASTRODUODENOSCOPY (EGD) (Left)  Patient location during evaluation: Endoscopy Anesthesia Type: General Level of consciousness: awake and alert Pain management: pain level controlled Vital Signs Assessment: post-procedure vital signs reviewed and stable Respiratory status: spontaneous breathing, nonlabored ventilation and respiratory function stable Cardiovascular status: blood pressure returned to baseline and stable Postop Assessment: no signs of nausea or vomiting Anesthetic complications: no     Last Vitals:  Vitals:   11/10/16 0907 11/10/16 0917  BP: 108/63 108/63  Pulse: 80 73  Resp: 18 18  Temp: (!) 36.1 C     Last Pain:  Vitals:   11/10/16 0907  TempSrc: Tympanic                 [name]

## 2016-11-10 NOTE — Progress Notes (Signed)
[employer] - [place] [facility]                                                                                                                                                                                  Patient Demographics   Joshua Vaughn, is a 83 y.o. male, DOB - March 28, 1934, ZOX:[id number]  Admit date - 11/07/2016   Admitting Physician Arnaldo Natal, MD  Outpatient Primary MD for the patient is Danella Penton, MD   LOS - 2  Subjective: Patient with dementia  Headache EGD done today not showing any ulcer but have some reflux esophagitis. Patient is drowsy after the procedure and did not eat his lunch.   Review of Systems:   CONSTITUTIONAL: Limited due to his dementia  Vitals:   Vitals:   11/10/16 0917 11/10/16 0927 11/10/16 1015 11/10/16 1448  BP: 108/63 108/65 (!) 109/49 117/63  Pulse: 73 68 69 66  Resp: 18 20 17    Temp:   98.7 F (37.1 C) 98.5 F (36.9 C)  TempSrc:   Oral Oral  SpO2: 96% 94% 93% 94%  Weight:      Height:        Wt Readings from Last 3 Encounters:  11/10/16 76.4 kg (168 lb 8 oz)  03/10/16 78 kg (172 lb)     Intake/Output Summary (Last 24 hours) at 11/10/16 1656 Last data filed at 11/10/16 0900  Gross per 24 hour  Intake              920 ml  Output                0 ml  Net              920 ml    Physical Exam:   GENERAL: Pleasant-appearing in no apparent distress.  HEAD, EYES, EARS, NOSE AND THROAT: Atraumatic, normocephalic. Extraocular muscles are intact. Pupils equal and reactive to light. Sclerae anicteric. No conjunctival injection. No oro-pharyngeal erythema.  NECK: Supple. There is no jugular venous distention. No bruits, no lymphadenopathy, no thyromegaly.  HEART: Regular rate and rhythm,. No murmurs, no rubs, no clicks.  LUNGS: Clear to auscultation bilaterally. No rales or rhonchi. No wheezes.  ABDOMEN: Soft, flat, nontender, nondistended. Has good bowel sounds. No hepatosplenomegaly appreciated.   EXTREMITIES: No evidence of any cyanosis, clubbing, or peripheral edema.  +2 pedal and radial pulses bilaterally.  NEUROLOGIC: The patient is sleepy, with no focal motor or sensory deficits appreciated bilaterally.  SKIN: Moist and warm with no rashes appreciated.  Psych: Not anxious, depressed LN: No inguinal LN enlargement    Antibiotics   Anti-infectives    None  Medications   Scheduled Meds: . chlorhexidine  15 mL Mouth Rinse BID  . docusate sodium  100 mg Oral BID  . mouth rinse  15 mL Mouth Rinse q12n4p  . memantine  10 mg Oral BID  . pantoprazole  40 mg Oral BID AC   Continuous Infusions:  PRN Meds:.acetaminophen **OR** acetaminophen, lactulose, ondansetron **OR** ondansetron (ZOFRAN) IV   Data Review:   Micro Results No results found for this or any previous visit (from the past 240 hour(s)).  Radiology Reports Dg Chest 2 View  Result Date: 11/08/2016 CLINICAL DATA:  Coffee-ground emesis at 22:30. EXAM: CHEST  2 VIEW COMPARISON:  None. FINDINGS: The lungs are clear. The pulmonary vasculature is normal. Heart size is normal. Hilar and mediastinal contours are unremarkable. There is no pleural effusion. IMPRESSION: No active cardiopulmonary disease. Electronically Signed   By: Ellery Plunkaniel R Mitchell M.D.   On: 11/08/2016 00:49     CBC  Recent Labs Lab 11/07/16 2356 11/09/16 0915 11/10/16 0316  WBC 13.6* 7.1 7.1  HGB 17.0 12.9* 12.8*  HCT 50.2 38.1* 37.9*  PLT 214 166 169  MCV 93.8 93.7 92.8  MCH 31.8 31.8 31.5  MCHC 33.9 33.9 33.9  RDW 13.7 13.8 14.0  LYMPHSABS  --  2.1  --   MONOABS  --  0.6  --   EOSABS  --  0.1  --   BASOSABS  --  0.0  --     Chemistries   Recent Labs Lab 11/07/16 2356 11/10/16 0316  NA 135 137  K 4.1 3.1*  CL 100* 109  CO2 27 24  GLUCOSE 144* 93  BUN 14 8  CREATININE 0.86 0.73  CALCIUM 9.5 8.3*  AST 29  --   ALT 20  --   ALKPHOS 60  --   BILITOT 1.8*  --     ------------------------------------------------------------------------------------------------------------------ estimated creatinine clearance is 79.6 mL/min (by C-G formula based on SCr of 0.73 mg/dL). ------------------------------------------------------------------------------------------------------------------  Recent Labs  11/07/16 2356  HGBA1C 5.6   ------------------------------------------------------------------------------------------------------------------ No results for input(s): CHOL, HDL, LDLCALC, TRIG, CHOLHDL, LDLDIRECT in the last 72 hours. ------------------------------------------------------------------------------------------------------------------  Recent Labs  11/07/16 2356  TSH 1.171   ------------------------------------------------------------------------------------------------------------------ No results for input(s): VITAMINB12, FOLATE, FERRITIN, TIBC, IRON, RETICCTPCT in the last 72 hours.  Coagulation profile  Recent Labs Lab 11/07/16 2356  INR 1.01    No results for input(s): DDIMER in the last 72 hours.  Cardiac Enzymes No results for input(s): CKMB, TROPONINI, MYOGLOBIN in the last 168 hours.  Invalid input(s): CK ------------------------------------------------------------------------------------------------------------------ Invalid input(s): POCBNP    Assessment & Plan   This is an 83 year old male admitted for hematemesis. 1. Hematemesis: Hemoglobin stable. EGD shows reflux esophagitis  Change to oral PPI BID. 2. GERD: Continue PPIs as above 3. Dementia: Continue Namenda 4. DVT prophylaxis: SCDs 5. GI prophylaxis: As above Overall have Decreased functional ability, patient is not able to walk for more than one year and increasing bleeding requiring support on transferring himself from bed to chair. His wife takes care of him at home and have her wheelchair and a lift. We will get physical therapy evaluation to assess  the requirement on discharge. Today after procedure patient is still sleepy and did not eat his lunch so we will keep in hospital tonight and watch tomorrow.        Code Status Orders        Start     Ordered   11/08/16 0324  Do not attempt resuscitation (DNR)  Continuous    Question Answer Comment  In the event of cardiac or respiratory ARREST Do not call a "code blue"   In the event of cardiac or respiratory ARREST Do not perform Intubation, CPR, defibrillation or ACLS   In the event of cardiac or respiratory ARREST Use medication by any route, position, wound care, and other measures to relive pain and suffering. May use oxygen, suction and manual treatment of airway obstruction as needed for comfort.      11/08/16 0323    Code Status History    Date Active Date Inactive Code Status Order ID Comments User Context   03/08/2016  7:27 PM 03/12/2016  3:56 PM DNR [id number]  Ramonita LabGouru, Aruna, MD Inpatient   03/08/2016  6:32 PM 03/08/2016  7:27 PM Full Code [id number]  Ramonita LabGouru, Aruna, MD ED    Advance Directive Documentation     Most Recent Value  Type of Advance Directive  Living will, Healthcare Power of Attorney  Pre-existing out of facility DNR order (yellow form or pink MOST form)  -  "MOST" Form in Place?  -      I discussed and updated the condition to patient's wife     Consults  Gastroenterology   DVT Prophylaxis  SCDs  Lab Results  Component Value Date   PLT 169 11/10/2016     Time Spent in minutes   35 minutes Greater than 50% of time spent in care coordination and counseling patient regarding the condition and plan of care.   Altamese DillingVACHHANI, Lyndel Sarate M.D on 11/10/2016 at 4:56 PM  Between 7am to 6pm - Pager - [phone]  After 6pm go to [email or web address] - password [email or web address] [facility]  [facility]   Office  [phone]

## 2016-11-10 NOTE — Anesthesia Preprocedure Evaluation (Signed)
Anesthesia Evaluation  Patient identified by MRN, date of birth, ID band Patient awake    Reviewed: Allergy & Precautions, NPO status , Patient's Chart, lab work & pertinent test results  History of Anesthesia Complications Negative for: history of anesthetic complications  Airway Mallampati: III  TM Distance: >3 FB Neck ROM: Full    Dental  (+) Upper Dentures, Lower Dentures   Pulmonary neg pulmonary ROS, neg sleep apnea, neg COPD,    breath sounds clear to auscultation- rhonchi (-) wheezing      Cardiovascular Exercise Tolerance: Good (-) hypertension(-) CAD, (-) Past MI and (-) Cardiac Stents  Rhythm:Regular Rate:Normal - Systolic murmurs and - Diastolic murmurs    Neuro/Psych Dementia  negative neurological ROS     GI/Hepatic Neg liver ROS, GERD  ,Possible GIB   Endo/Other  negative endocrine ROSneg diabetes  Renal/GU negative Renal ROS     Musculoskeletal negative musculoskeletal ROS (+)   Abdominal (+) - obese,   Peds  Hematology negative hematology ROS (+)   Anesthesia Other Findings Past Medical History: No date: Dementia No date: GERD (gastroesophageal reflux disease)   Reproductive/Obstetrics                             Anesthesia Physical Anesthesia Plan  ASA: II  Anesthesia Plan: General   Post-op Pain Management:    Induction: Intravenous  PONV Risk Score and Plan: 1 and Propofol infusion  Airway Management Planned: Natural Airway  Additional Equipment:   Intra-op Plan:   Post-operative Plan:   Informed Consent: I have reviewed the patients History and Physical, chart, labs and discussed the procedure including the risks, benefits and alternatives for the proposed anesthesia with the patient or authorized representative who has indicated his/her understanding and acceptance.   Dental advisory given  Plan Discussed with: CRNA and  Anesthesiologist  Anesthesia Plan Comments:         Anesthesia Quick Evaluation

## 2016-11-10 NOTE — Anesthesia Post-op Follow-up Note (Cosign Needed)
Anesthesia QCDR form completed.        

## 2016-11-10 NOTE — H&P (Signed)
  Wyline MoodKiran Quetzal Meany MD [address] [place], [place] [address] Phone: [phone] Fax : [phone]  Primary Care Physician:  Danella PentonMiller, Mark F, MD Primary Gastroenterologist:  Dr. Wyline MoodKiran Shanielle Correll   Pre-Procedure History & Physical: HPI:  Joshua FellersJoe P Vaughn is a 83 y.o. male is here for an endoscopy.   Past Medical History:  Diagnosis Date  . Dementia   . GERD (gastroesophageal reflux disease)     Past Surgical History:  Procedure Laterality Date  . ESOPHAGOGASTRODUODENOSCOPY (EGD) WITH PROPOFOL N/A 03/10/2016   Procedure: ESOPHAGOGASTRODUODENOSCOPY (EGD) WITH PROPOFOL;  Surgeon: Scot Junobert T Elliott, MD;  [facility];  Service: Endoscopy;  Laterality: N/A;    Prior to Admission medications   Medication Sig Start Date End Date Taking? Authorizing Provider  docusate sodium (COLACE) 100 MG capsule Take 1 capsule (100 mg total) by mouth 2 (two) times daily. 03/12/16  Yes Wieting, Richard, MD  memantine (NAMENDA) 10 MG tablet 10 mg 2 (two) times daily.  03/02/16  Yes [provider]    Allergies as of 11/07/2016  . (No Known Allergies)    History reviewed. No pertinent family history.  Social History   Social History  . Marital status: Married    Spouse name: N/A  . Number of children: N/A  . Years of education: N/A   Occupational History  . Not on file.   Social History Main Topics  . Smoking status: Never Smoker  . Smokeless tobacco: Never Used  . Alcohol use No  . Drug use: No  . Sexual activity: Not on file   Other Topics Concern  . Not on file   Social History Narrative  . No narrative on file    Review of Systems: See HPI, otherwise negative ROS  Physical Exam: BP 117/69   Pulse 64   Temp 98.1 F (36.7 C) (Tympanic)   Resp 18   Ht 6\' 5"  (1.956 m)   Wt 168 lb 8 oz (76.4 kg)   SpO2 92%   BMI 19.98 kg/m  General:   Alert,  pleasant and cooperative in NAD Head:  Normocephalic and atraumatic. Neck:  Supple; no masses or  thyromegaly. Lungs:  Clear throughout to auscultation.    Heart:  Regular rate and rhythm. Abdomen:  Soft, nontender and nondistended. Normal bowel sounds, without guarding, and without rebound.   Neurologic:  Alert and  oriented x2;   Impression/Plan: Joshua Vaughn is here for an endoscopy to be performed for hematemesis  Risks, benefits, limitations, and alternatives regarding  endoscopy have been reviewed with the patient.  Questions have been answered.  All parties agreeable.   Wyline MoodKiran Joshua Janney, MD  11/10/2016, 8:46 AM

## 2016-11-10 NOTE — Op Note (Signed)
[facility] Gastroenterology Patient Name: Joshua Vaughn Procedure Date: 11/10/2016 8:47 AM MRN: [id number] Account #: [id number] Date of Birth: 02/05/1934 Admit Type: Inpatient Age: [age] [facility] Gender: Male Note Status: Finalized Procedure:            Upper GI endoscopy Indications:          Hematemesis Providers:            Wyline MoodKiran Prisilla Kocsis MD, MD Referring MD:         No Local Md, MD (Referring MD) Medicines:            Monitored Anesthesia Care Complications:        No immediate complications. Procedure:            Pre-Anesthesia Assessment:                       - Prior to the procedure, a History and Physical was                        performed, and patient medications, allergies and                        sensitivities were reviewed. The patient's tolerance of                        previous anesthesia was reviewed.                       - The risks and benefits of the procedure and the                        sedation options and risks were discussed with the                        patient. All questions were answered and informed                        consent was obtained.                       - ASA Grade Assessment: III - A patient with severe                        systemic disease.                       After obtaining informed consent, the endoscope was                        passed under direct vision. Throughout the procedure,                        the patient's blood pressure, pulse, and oxygen                        saturations were monitored continuously. The Endoscope                        was introduced through the mouth, and advanced to the  third part of duodenum. The upper GI endoscopy was                        accomplished with ease. The patient tolerated the                        procedure well. Findings:      The examined duodenum was normal.      A 3 cm hiatal hernia was present.      LA Grade A (one  or more mucosal breaks less than 5 mm, not extending       between tops of 2 mucosal folds) esophagitis with no bleeding was found       in the lower third of the esophagus. Impression:           - Normal examined duodenum.                       - 3 cm hiatal hernia.                       - LA Grade A reflux esophagitis.                       - No specimens collected. Recommendation:       - Return patient to hospital ward for ongoing care.                       - 1. Keep head end of the bed elevated at 45 degrees                       2. Can be discharged on oral PPI for 6weeks and then                        can change to Zantac 150mg  BID                       3. Avoid NSAID use Procedure Code(s):    --- Professional ---                       [phone], Esophagogastroduodenoscopy, flexible, transoral;                        diagnostic, including collection of specimen(s) by                        brushing or washing, when performed (separate procedure) Diagnosis Code(s):    --- Professional ---                       K44.9, Diaphragmatic hernia without obstruction or                        gangrene                       K21.0, Gastro-esophageal reflux disease with esophagitis                       K92.0, Hematemesis CPT copyright 2016 American Medical Association. All rights reserved. The codes documented in this report are preliminary and upon coder review may  be revised to meet current compliance requirements. Wyline MoodKiran Desta Bujak, MD Wyline MoodKiran Yeira Gulden MD, MD 11/10/2016 9:03:27 AM This report has been signed electronically. Number of Addenda: 0 Note Initiated On: 11/10/2016 8:47 AM      [facility]

## 2016-11-11 ENCOUNTER — Encounter: Payer: Self-pay | Admitting: Gastroenterology

## 2016-11-11 DIAGNOSIS — K920 Hematemesis: Secondary | ICD-10-CM | POA: Diagnosis not present

## 2016-11-11 DIAGNOSIS — K210 Gastro-esophageal reflux disease with esophagitis: Secondary | ICD-10-CM | POA: Diagnosis not present

## 2016-11-11 MED ORDER — ENSURE ENLIVE PO LIQD
237.0000 mL | Freq: Three times a day (TID) | ORAL | Status: DC
  Administered 2016-11-11 (×3): 237 mL via ORAL

## 2016-11-11 MED ORDER — CIPROFLOXACIN HCL 0.3 % OP SOLN
2.0000 [drp] | OPHTHALMIC | Status: DC
  Administered 2016-11-11 – 2016-11-12 (×3): 2 [drp] via OPHTHALMIC
  Filled 2016-11-11: qty 2.5

## 2016-11-11 MED ORDER — ADULT MULTIVITAMIN W/MINERALS CH
1.0000 | ORAL_TABLET | Freq: Every day | ORAL | Status: DC
  Administered 2016-11-11 – 2016-11-12 (×2): 1 via ORAL
  Filled 2016-11-11 (×2): qty 1

## 2016-11-11 MED ORDER — POTASSIUM CHLORIDE CRYS ER 20 MEQ PO TBCR
40.0000 meq | EXTENDED_RELEASE_TABLET | Freq: Two times a day (BID) | ORAL | Status: DC
  Administered 2016-11-11 – 2016-11-12 (×3): 40 meq via ORAL
  Filled 2016-11-11 (×3): qty 2

## 2016-11-11 NOTE — Progress Notes (Addendum)
Initial Nutrition Assessment  DOCUMENTATION CODES:   Severe malnutrition in context of chronic illness  INTERVENTION:  1. Ensure Enlive po BID, each supplement provides 350 kcal and 20 grams of protein 2. Magic cup TID with meals, each supplement provides 290 kcal and 9 grams of protein 3. MVI w/ Minerals  NUTRITION DIAGNOSIS:   Severe Malnutrition related to chronic illness (dementia) as evidenced by severe depletion of muscle mass, severe depletion of body fat.  GOAL:   Patient will meet greater than or equal to 90% of their needs  MONITOR:   PO intake, I & O's, Labs, Supplement acceptance  REASON FOR ASSESSMENT:   Low Braden    ASSESSMENT:   83 yo male with hx of dementia, sickle cell anemia, admitted for hematemesis underwent EGD which showed reflux esophagitis  Spoke with family at bedside. Patient is unable to provide any history. Wife states he eats well at home, normally eats 2 meals per day Eggs, grits, sausage, bacon for breakfast, and meat, vegetables, potatoes for dinner. Occasionally snacks, but otherwise, doesn't eat much. She denies any weight loss based on the way patient's clothes have been fitting, but states that she is unable to weight him because he can't stand. Underwent EGD yesterday, was found to have reflux esophagitis but otherwise ok.  Despite wife's iterations, patient is not meeting needs  Nutrition-Focused physical exam completed. Findings are severe fat depletion, severe muscle depletion, and no edema.  5L Fluid Positive, no UOP documented yesterday Labs reviewed:  K+ 3.1, TBili 1.8, HGB/HCT 12.8/37.9 Medications reviewed and include:  [place]al[place]  Diet Order:  DIET SOFT Room service appropriate? Yes; Fluid consistency: Thin  Skin:  Wound (see comment) (BL ecchymosis to arms, abrasion to ankle)  Last BM:  11/09/2016 (Type 5)  Height:   Ht Readings from Last 1 Encounters:  11/08/16 6\' 5"  (1.956 m)    Weight:   Wt Readings from Last 1  Encounters:  11/11/16 167 lb 15.9 oz (76.2 kg)    Ideal Body Weight:  94.54 kg  BMI:  Body mass index is 19.92 kg/m.  Estimated Nutritional Needs:   Kcal:  1755-1914 calories (MSJ x1.1-1.2)  Protein:  115-150 grams (1.5-2g/kg)  Fluid:  1.8 - 2L  EDUCATION NEEDS:   Education needs addressed  Dionne AnoWilliam M. Mckaila Duffus, MS, RD LDN Inpatient Clinical Dietitian Pager [phone]

## 2016-11-11 NOTE — Progress Notes (Signed)
Clinical Child psychotherapistocial Worker (CSW) presented bed offers to patient's wife and her 2 adult children. Per wife they will discuss bed offers and get back with CSW.   [employer] Hughes IncorporatedBailey Adayah Arocho, LCSW [phone]

## 2016-11-11 NOTE — Clinical Social Work Note (Signed)
Clinical Social Work Assessment  Patient Details  Name: Joshua Vaughn MRN: [id number] Date of Birth: Sep 14, 1933  Date of referral:  11/11/16               Reason for consult:  Facility Placement                Permission sought to share information with:  Chartered certified accountant granted to share information::  Yes, Verbal Permission Granted  Name::      [place]::   [place]   Relationship::     Contact Information:     Housing/Transportation Living arrangements for the past 2 months:  Single Family Home Source of Information:  Adult Children, Spouse Patient Interpreter Needed:  None Criminal Activity/Legal Involvement Pertinent to Current Situation/Hospitalization:  No - Comment as needed Significant Relationships:  Adult Children, Spouse Lives with:  Spouse Do you feel safe going back to the place where you live?  Yes Need for family participation in patient care:  Yes (Comment)  Care giving concerns:  Patient lives in [place] with his wife Joshua Vaughn [phone].    Social Worker assessment / plan: Holiday representative (CSW) received verbal SNF consult from PT. CSW met with patient who was pleasant however did not participate in assessment. Patient's daughter Joshua Vaughn [phone] and wife Joshua Vaughn were at bedside. CSW introduced self and explained role of CSW department. Per wife patient lives at home with her and has lift at home. Wife reported that patient was able to assist in transfers but now has not been able to. CSW explained SNF process for short term rehab and long term care. Wife is agreeable to SNF search for short term rehab and doesn't want long term care at this point. Wife prefers [facility] Per wife they have a few respite hours through elder care at home. Wife and daughter also stated that they would like palliative care to follow at rehab. [facility] hospice liaison is aware of above. FL2 complete and faxed out. CSW  will continue to follow and assist as needed.   Employment status:  Retired Nurse, adult PT Recommendations:  [place] / Referral to community resources:  [place]  Patient/Family's Response to care:  Patient's wife and daughter are agreeable to [employer] and prefer [facility]   Patient/Family's Understanding of and Emotional Response to Diagnosis, Current Treatment, and Prognosis: Patient's wife and daughter were very pleasant and thanked CSW for assistance.   Emotional Assessment Appearance:  Appears stated age Attitude/Demeanor/Rapport:  Unable to Assess Affect (typically observed):  Pleasant Orientation:  Oriented to Self, Fluctuating Orientation (Suspected and/or reported Sundowners) Alcohol / Substance use:  Not Applicable Psych involvement (Current and /or in the community):  No (Comment)  Discharge Needs  Concerns to be addressed:  Discharge Planning Concerns Readmission within the last 30 days:  No Current discharge risk:  Dependent with Mobility, Chronically ill Barriers to Discharge:  Continued Medical Work up   [employer], Veronia Beets, LCSW 11/11/2016, 12:13 PM

## 2016-11-11 NOTE — Evaluation (Signed)
Physical Therapy Evaluation Patient Details Name: Joshua Vaughn MRN: [id number] DOB: December 24, 1933 Today's Date: 11/11/2016   History of Present Illness  Pt is a 83 y/o M who presented after 3 episodes of vomiting. Chest x-ray was clear.  Pt underwent endoscopy on 11/10/16 which showed reflux esophagitis.  Pt has additionally been demonstrating decreased functional ability and thus PT was consulted. Pt's PMH includes dementia.    Clinical Impression  Pt admitted with above diagnosis. Pt currently with functional limitations due to the deficits listed below (see PT Problem List). Per report from family, pt is not at his baseline level of mobility.  Approximately 1 month PTA wife and daughter were able to assist pt with stand pivot transfers chair<>bed but pt has demonstrated generalized weakness over the past month, requiring [place] of a lift at home for transfers.  The wife is have a very challenging time providing the amount of physical assist the pt currently requires.  The pt currently requires max assist for bed mobility which is not his baseline from one month PTA.  Given pt's current mobility status, recommending SNF at d/c.  Pt will benefit from skilled PT to increase their independence and safety with mobility to allow discharge to the venue listed below.      Follow Up Recommendations SNF    Equipment Recommendations  None recommended by PT    Recommendations for Other Services       Precautions / Restrictions Precautions Precautions: Fall Restrictions Weight Bearing Restrictions: No      Mobility  Bed Mobility Overal bed mobility: Needs Assistance Bed Mobility: Supine to Sit;Sit to Supine     Supine to sit: Max assist;HOB elevated Sit to supine: Max assist;+2 for physical assistance   General bed mobility comments: Pt requires hand over hand placement for initiation of activity.  He assist very minimally for supine>sit and requires assist to advance LEs to EOB and to elevate  trunk.  To return to supine he requires max +2 assist due to fatigue.  Transfers                 General transfer comment: Not safe to attempt at this time due to pt's posterior lean in sitting without successful correction  Ambulation/Gait             General Gait Details: Pt is nonambulatory at baseline  Stairs            Wheelchair Mobility    Modified Rankin (Stroke Patients Only)       Balance Overall balance assessment: Needs assistance;History of Falls Sitting-balance support: Single extremity supported;Feet supported Sitting balance-Leahy Scale: Poor Sitting balance - Comments: Pt relies on at least 1UE supported with min guard>mod assist to maintain balance sitting EOB. Postural control: Posterior lean                                   Pertinent Vitals/Pain Pain Assessment: Faces Faces Pain Scale: No hurt Pain Intervention(s): Monitored during session    Home Living Family/patient expects to be discharged to:: Private residence Living Arrangements: Spouse/significant other;Other relatives (daughter) Available Help at Discharge: Family;Available 24 hours/day Type of Home: House Home Access: Ramped entrance     Home Layout: One level Home Equipment: Transport chair;Bedside commode (Lift)      Prior Function Level of Independence: Needs assistance   Gait / Transfers Assistance Needed: For the past 4 weeks the wife  has has to use the lift as the pt has been more weak.  Prior to this pt was able to perform a stand pivot transfer with wife and daughter's assist.  Family has had to call EMS to assist pt from floor after attempted stand pivot transfer x2 over the past few months.  ADL's / Homemaking Assistance Needed: Needs assist with bathing, dressing.  Wife does the cooking, cleaning.    Comments: Wife uses lift to transfer pt to transport chair in the morning and then uses lift again to transport pt from the transport chair to  lift chair.     Hand Dominance   Dominant Hand: Left    Extremity/Trunk Assessment   Upper Extremity Assessment Upper Extremity Assessment:  (BUE strength grossly 4/5)    Lower Extremity Assessment Lower Extremity Assessment:  (BLE strength grossly 3/5)    Cervical / Trunk Assessment Cervical / Trunk Assessment: Kyphotic  Communication   Communication: No difficulties  Cognition Arousal/Alertness: Awake/alert Behavior During Therapy: WFL for tasks assessed/performed Overall Cognitive Status: History of cognitive impairments - at baseline                                 General Comments: Pt pleasantly confused      General Comments      Exercises General Exercises - Lower Extremity Ankle Circles/Pumps: AAROM;Both;10 reps;Supine Heel Slides: AAROM;Both;10 reps;Supine Hip ABduction/ADduction: AAROM;Both;10 reps;Supine Straight Leg Raises: AAROM;Both;10 reps;Supine Other Exercises Other Exercises: Used hand over hand technique to attempt pt to correct posterior lean by reaching anteriorly.   Other Exercises: Pt sat EOB with at least 1UE supported and min guard>min assist to maintain balance.     Assessment/Plan    PT Assessment Patient needs continued PT services  PT Problem List Decreased strength;Decreased range of motion;Decreased activity tolerance;Decreased balance;Decreased mobility;Decreased knowledge of use of DME;Decreased safety awareness;Decreased cognition       PT Treatment Interventions DME instruction;Functional mobility training;Therapeutic activities;Therapeutic exercise;Balance training;Neuromuscular re-education;Cognitive remediation;Patient/family education;Wheelchair mobility training    PT Goals (Current goals can be found in the Care Plan section)  Acute Rehab PT Goals Patient Stated Goal: unable to state PT Goal Formulation: With patient/family Time For Goal Achievement: 11/25/16 Potential to Achieve Goals: Fair    Frequency  Min 2X/week   Barriers to discharge Decreased caregiver support Wife having a difficult time providing amount of assist the pt currently requires    Co-evaluation               AM-PAC PT "6 Clicks" Daily Activity  Outcome Measure Difficulty turning over in bed (including adjusting bedclothes, sheets and blankets)?: Total Difficulty moving from lying on back to sitting on the side of the bed? : Total Difficulty sitting down on and standing up from a chair with arms (e.g., wheelchair, bedside commode, etc,.)?: Total Help needed moving to and from a bed to chair (including a wheelchair)?: Total Help needed walking in hospital room?: Total Help needed climbing 3-5 steps with a railing? : Total 6 Click Score: 6    End of Session   Activity Tolerance: Patient limited by fatigue Patient left: in bed;with call bell/phone within reach;with bed alarm set;with SCD's reapplied;with family/visitor present Nurse Communication: Mobility status;Need for lift equipment;Other (comment) (pt with small skin tear R lateral upper arm) PT Visit Diagnosis: Muscle weakness (generalized) (M62.81);Unsteadiness on feet (R26.81);History of falling (Z91.81);Difficulty in walking, not elsewhere classified (R26.2)    Time: [id number]  PT Time Calculation (min) (ACUTE ONLY): 44 min   Charges:   PT Evaluation $PT Eval Low Complexity: 1 Low PT Treatments $Therapeutic Exercise: 8-22 mins $Therapeutic Activity: 8-22 mins   PT G Codes:        Encarnacion ChuAshley Yuridiana Formanek PT, DPT 11/11/2016, 11:42 AM

## 2016-11-11 NOTE — Clinical Social Work Placement (Signed)
   CLINICAL SOCIAL WORK PLACEMENT  NOTE  Date:  11/11/2016  Patient Details  Name: Joshua Vaughn MRN: [id number] Date of Birth: 06/05/1933  Clinical Social Work is seeking post-discharge placement for this patient at the Skilled  Nursing Facility level of care (*CSW will initial, date and re-position this form in  chart as items are completed):  Yes   Patient/family provided with [facility] Clinical Social Work Department's list of facilities offering this level of care within the geographic area requested by the patient (or if unable, by the patient's family).  Yes   Patient/family informed of their freedom to choose among providers that offer the needed level of care, that participate in Medicare, Medicaid or managed care program needed by the patient, have an available bed and are willing to accept the patient.  Yes   Patient/family informed of [facility] [place]'s ownership interest in [facility] and [facility], as well as of the fact that they are under no obligation to receive care at these facilities.  PASRR submitted to EDS on 11/11/16     PASRR number received on 11/11/16     Existing PASRR number confirmed on       FL2 transmitted to all facilities in geographic area requested by pt/family on 11/11/16     FL2 transmitted to all facilities within larger geographic area on       Patient informed that his/her managed care company has contracts with or will negotiate with certain facilities, including the following:            Patient/family informed of bed offers received.  Patient chooses bed at       Physician recommends and patient chooses bed at      Patient to be transferred to   on  .  Patient to be transferred to facility by       Patient family notified on   of transfer.  Name of family member notified:        PHYSICIAN       Additional Comment:    _______________________________________________ Savanna Dooley, Darleen CrockerBailey M, LCSW 11/11/2016, 12:12 PM

## 2016-11-11 NOTE — Progress Notes (Signed)
MD notified of discharge from patient's eyes. MD states he will assess patient.

## 2016-11-11 NOTE — NC FL2 (Signed)
[place] MEDICAID FL2 LEVEL OF CARE SCREENING TOOL     IDENTIFICATION  Patient Name: Joshua Vaughn Birthdate: 03/08/1934 Sex: male Admission Date (Current Location): 11/07/2016  [place]   Facility and Address:  [facility], [address], [place], [place] [address]      Provider Number: [id number]  Attending Physician Name and Address:  Altamese DillingVachhani, Matan Steen, *  Relative Name and Phone Number:       Current Level of Care: Hospital Recommended Level of Care: Skilled Nursing Facility Prior Approval Number:    Date Approved/Denied:   PASRR Number:  [id number])  Discharge Plan: SNF    Current Diagnoses: Patient Active Problem List   Diagnosis Date Noted  . Hematemesis 11/08/2016  . Coffee ground emesis 03/08/2016    Orientation RESPIRATION BLADDER Height & Weight     Self  Normal Incontinent Weight: 167 lb 15.9 oz (76.2 kg) Height:  6\' 5"  (195.6 cm)  BEHAVIORAL SYMPTOMS/MOOD NEUROLOGICAL BOWEL NUTRITION STATUS   (none)  (none) Continent Diet (Diet: Soft )  AMBULATORY STATUS COMMUNICATION OF NEEDS Skin   Extensive Assist Verbally Normal                       Personal Care Assistance Level of Assistance  Bathing, Feeding, Dressing Bathing Assistance: Limited assistance Feeding assistance: Limited assistance Dressing Assistance: Limited assistance     Functional Limitations Info  Sight, Hearing, Speech Sight Info: Impaired Hearing Info: Adequate Speech Info: Adequate    SPECIAL CARE FACTORS FREQUENCY  PT (By licensed PT), OT (By licensed OT)     PT Frequency:  (5) OT Frequency:  (5)            Contractures      Additional Factors Info  Code Status, Allergies Code Status Info:  (DNR ) Allergies Info:  (No Known Allergies. )           Current Medications (11/11/2016):  This is the current hospital active medication list Current Facility-Administered Medications  Medication Dose  Route Frequency Provider Last Rate Last Dose  . acetaminophen (TYLENOL) tablet 650 mg  650 mg Oral Q6H PRN Arnaldo Nataliamond, Michael S, MD       Or  . acetaminophen (TYLENOL) suppository 650 mg  650 mg Rectal Q6H PRN Arnaldo Nataliamond, Michael S, MD      . chlorhexidine (PERIDEX) 0.12 % solution 15 mL  15 mL Mouth Rinse BID Arnaldo Nataliamond, Michael S, MD   15 mL at 11/11/16 [id number]  . docusate sodium (COLACE) capsule 100 mg  100 mg Oral BID Arnaldo Nataliamond, Michael S, MD   100 mg at 11/11/16 [id number]  . lactulose (CHRONULAC) 10 GM/15ML solution 20 g  20 g Oral Daily PRN Arnaldo Nataliamond, Michael S, MD      . MEDLINE mouth rinse  15 mL Mouth Rinse q12n4p Arnaldo Nataliamond, Michael S, MD   15 mL at 11/08/16 1718  . memantine (NAMENDA) tablet 10 mg  10 mg Oral BID Arnaldo Nataliamond, Michael S, MD   10 mg at 11/11/16 [id number]  . ondansetron (ZOFRAN) tablet 4 mg  4 mg Oral Q6H PRN Arnaldo Nataliamond, Michael S, MD       Or  . ondansetron [facility](ZOFRAN) injection 4 mg  4 mg Intravenous Q6H PRN Arnaldo Nataliamond, Michael S, MD      . pantoprazole (PROTONIX) EC tablet 40 mg  40 mg Oral BID AC Altamese DillingVachhani, Labrea Eccleston, MD   40 mg at 11/11/16 [id number]     Discharge Medications:  Please see discharge summary for a list of discharge medications.  Relevant Imaging Results:  Relevant Lab Results:   Additional Information  (SSN: [id number])  Sample, Darleen CrockerBailey M, LCSW

## 2016-11-11 NOTE — Progress Notes (Signed)
Clinical Social Worker (CSW) met with patient's wife and daughter to get SNF choice. They chose [facility]. Tiffany admissions coordinator at [facility] is aware of accepted bed offer. Plan is for patient to D/C to [facility] tomorrow pending medical clearance.   [employer], LCSW [phone]

## 2016-11-11 NOTE — Progress Notes (Signed)
Patient having yellow discharge from bilateral eyes. MD paged.

## 2016-11-11 NOTE — Progress Notes (Signed)
[facility] [place] [facility]                                                                                                                                                                                  Patient Demographics   Joshua Vaughn, is a 83 y.o. male, DOB - 03/08/1934, ZOX:[id number]  Admit date - 11/07/2016   Admitting Physician Arnaldo NatalMichael S Diamond, MD  Outpatient Primary MD for the patient is Danella PentonMiller, Mark F, MD   LOS - 3  Subjective: Patient with dementia  Headache EGD done not showing any ulcer but have some reflux esophagitis. Completely alert today, no complains.  Review of Systems:   CONSTITUTIONAL: No fever,positive for fatigue or weakness.  EYES: No blurred or double vision.  EARS, NOSE, AND THROAT: No tinnitus or ear pain.  RESPIRATORY: positive for cough, shortness of breath, no wheezing or hemoptysis.  CARDIOVASCULAR: No chest pain, orthopnea, edema.  GASTROINTESTINAL: No nausea, vomiting, have diarrhea , no abdominal pain.  GENITOURINARY: No dysuria, hematuria.  ENDOCRINE: No polyuria, nocturia,  HEMATOLOGY: No anemia, easy bruising or bleeding SKIN: No rash or lesion. MUSCULOSKELETAL: No joint pain or arthritis.   NEUROLOGIC: No tingling, numbness, weakness.  PSYCHIATRY: No anxiety or depression.   Vitals:   Vitals:   11/11/16 0100 11/11/16 0500 11/11/16 0719 11/11/16 1523  BP: 120/66  (!) 118/58 (!) 107/52  Pulse: 70  71 78  Resp: 18  17 18   Temp: 98.4 F (36.9 C)  98.1 F (36.7 C) 97.7 F (36.5 C)  TempSrc: Oral  Oral Oral  SpO2: 95%  94% 94%  Weight:  76.2 kg (167 lb 15.9 oz)    Height:        Wt Readings from Last 3 Encounters:  11/11/16 76.2 kg (167 lb 15.9 oz)  03/10/16 78 kg (172 lb)     Intake/Output Summary (Last 24 hours) at 11/11/16 1610 Last data filed at 11/10/16 2104  Gross per 24 hour  Intake              360 ml  Output                0 ml  Net              360 ml    Physical Exam:   GENERAL:  Pleasant-appearing in no apparent distress.  HEAD, EYES, EARS, NOSE AND THROAT: Atraumatic, normocephalic. Extraocular muscles are intact. Pupils equal and reactive to light. Sclerae anicteric. No conjunctival injection. No oro-pharyngeal erythema.  NECK: Supple. There is no jugular venous distention. No bruits, no lymphadenopathy, no thyromegaly.  HEART: Regular rate and rhythm,. No murmurs, no rubs,  no clicks.  LUNGS: Clear to auscultation bilaterally. No rales or rhonchi. No wheezes.  ABDOMEN: Soft, flat, nontender, nondistended. Has good bowel sounds. No hepatosplenomegaly appreciated.  EXTREMITIES: No evidence of any cyanosis, clubbing, or peripheral edema.  +2 pedal and radial pulses bilaterally.  NEUROLOGIC: The patient is alert, with no focal motor or sensory deficits appreciated bilaterally.  SKIN: Moist and warm with no rashes appreciated.  Psych: Not anxious, depressed LN: No inguinal LN enlargement    Antibiotics   Anti-infectives    None      Medications   Scheduled Meds: . chlorhexidine  15 mL Mouth Rinse BID  . ciprofloxacin  2 drop Both Eyes Q4H while awake  . docusate sodium  100 mg Oral BID  . feeding supplement (ENSURE ENLIVE)  237 mL Oral TID BM  . mouth rinse  15 mL Mouth Rinse q12n4p  . memantine  10 mg Oral BID  . multivitamin with minerals  1 tablet Oral Daily  . pantoprazole  40 mg Oral BID AC  . potassium chloride  40 mEq Oral BID   Continuous Infusions:  PRN Meds:.acetaminophen **OR** acetaminophen, lactulose, ondansetron **OR** ondansetron (ZOFRAN) IV   Data Review:   Micro Results No results found for this or any previous visit (from the past 240 hour(s)).  Radiology Reports Dg Chest 2 View  Result Date: 11/08/2016 CLINICAL DATA:  Coffee-ground emesis at 22:30. EXAM: CHEST  2 VIEW COMPARISON:  None. FINDINGS: The lungs are clear. The pulmonary vasculature is normal. Heart size is normal. Hilar and mediastinal contours are unremarkable. There  is no pleural effusion. IMPRESSION: No active cardiopulmonary disease. Electronically Signed   By: Ellery Plunk M.D.   On: 11/08/2016 00:49     CBC  Recent Labs Lab 11/07/16 2356 11/09/16 0915 11/10/16 0316  WBC 13.6* 7.1 7.1  HGB 17.0 12.9* 12.8*  HCT 50.2 38.1* 37.9*  PLT 214 166 169  MCV 93.8 93.7 92.8  MCH 31.8 31.8 31.5  MCHC 33.9 33.9 33.9  RDW 13.7 13.8 14.0  LYMPHSABS  --  2.1  --   MONOABS  --  0.6  --   EOSABS  --  0.1  --   BASOSABS  --  0.0  --     Chemistries   Recent Labs Lab 11/07/16 2356 11/10/16 0316  NA 135 137  K 4.1 3.1*  CL 100* 109  CO2 27 24  GLUCOSE 144* 93  BUN 14 8  CREATININE 0.86 0.73  CALCIUM 9.5 8.3*  AST 29  --   ALT 20  --   ALKPHOS 60  --   BILITOT 1.8*  --    ------------------------------------------------------------------------------------------------------------------ estimated creatinine clearance is 79.4 mL/min (by C-G formula based on SCr of 0.73 mg/dL). ------------------------------------------------------------------------------------------------------------------ No results for input(s): HGBA1C in the last 72 hours. ------------------------------------------------------------------------------------------------------------------ No results for input(s): CHOL, HDL, LDLCALC, TRIG, CHOLHDL, LDLDIRECT in the last 72 hours. ------------------------------------------------------------------------------------------------------------------ No results for input(s): TSH, T4TOTAL, T3FREE, THYROIDAB in the last 72 hours.  Invalid input(s): FREET3 ------------------------------------------------------------------------------------------------------------------ No results for input(s): VITAMINB12, FOLATE, FERRITIN, TIBC, IRON, RETICCTPCT in the last 72 hours.  Coagulation profile  Recent Labs Lab 11/07/16 2356  INR 1.01    No results for input(s): DDIMER in the last 72 hours.  Cardiac Enzymes No results for  input(s): CKMB, TROPONINI, MYOGLOBIN in the last 168 hours.  Invalid input(s): CK ------------------------------------------------------------------------------------------------------------------ Invalid input(s): POCBNP    Assessment & Plan   This is an 83 year old male admitted for hematemesis. 1. Hematemesis:  Hemoglobin stable. EGD shows reflux esophagitis  Change to oral PPI BID. 2. GERD: Continue PPIs as above 3. Dementia: Continue Namenda 4. DVT prophylaxis: SCDs 5. GI prophylaxis: As above 6. Eye irritaion     cipro eye drops. Overall have Decreased functional ability, patient is not able to walk for more than one year and increasing bleeding requiring support on transferring himself from bed to chair. His wife takes care of him at home and have her wheelchair and a lift.     CSW started working on Manufacturing engineerinsurance authorizations, will likely d/c tomorrow.     Code Status Orders        Start     Ordered   11/08/16 0324  Do not attempt resuscitation (DNR)  Continuous    Question Answer Comment  In the event of cardiac or respiratory ARREST Do not call a "code blue"   In the event of cardiac or respiratory ARREST Do not perform Intubation, CPR, defibrillation or ACLS   In the event of cardiac or respiratory ARREST Use medication by any route, position, wound care, and other measures to relive pain and suffering. May use oxygen, suction and manual treatment of airway obstruction as needed for comfort.      11/08/16 0323    Code Status History    Date Active Date Inactive Code Status Order ID Comments User Context   03/08/2016  7:27 PM 03/12/2016  3:56 PM DNR [id number]  Ramonita LabGouru, Aruna, MD Inpatient   03/08/2016  6:32 PM 03/08/2016  7:27 PM Full Code [id number]  Ramonita LabGouru, Aruna, MD ED    Advance Directive Documentation     Most Recent Value  Type of Advance Directive  Living will, Healthcare Power of Attorney  Pre-existing out of facility DNR order (yellow form or pink MOST form)   -  "MOST" Form in Place?  -      I discussed and updated the condition to patient's wife    Consults  Gastroenterology   DVT Prophylaxis  SCDs  Lab Results  Component Value Date   PLT 169 11/10/2016     Time Spent in minutes   35 minutes Greater than 50% of time spent in care coordination and counseling patient regarding the condition and plan of care.   Altamese DillingVACHHANI, Kapena Hamme M.D on 11/11/2016 at 4:10 PM  Between 7am to 6pm - Pager - [phone]  After 6pm go to [email or web address] - password [email or web address] [facility]  [facility]   Office  [phone]

## 2016-11-12 DIAGNOSIS — K449 Diaphragmatic hernia without obstruction or gangrene: Secondary | ICD-10-CM | POA: Diagnosis not present

## 2016-11-12 DIAGNOSIS — K920 Hematemesis: Secondary | ICD-10-CM | POA: Diagnosis present

## 2016-11-12 DIAGNOSIS — Z681 Body mass index (BMI) 19 or less, adult: Secondary | ICD-10-CM | POA: Diagnosis not present

## 2016-11-12 DIAGNOSIS — Z66 Do not resuscitate: Secondary | ICD-10-CM | POA: Diagnosis not present

## 2016-11-12 DIAGNOSIS — F0390 Unspecified dementia without behavioral disturbance: Secondary | ICD-10-CM | POA: Diagnosis not present

## 2016-11-12 DIAGNOSIS — K210 Gastro-esophageal reflux disease with esophagitis: Secondary | ICD-10-CM | POA: Diagnosis not present

## 2016-11-12 DIAGNOSIS — Z79899 Other long term (current) drug therapy: Secondary | ICD-10-CM | POA: Diagnosis not present

## 2016-11-12 DIAGNOSIS — E43 Unspecified severe protein-calorie malnutrition: Secondary | ICD-10-CM | POA: Diagnosis not present

## 2016-11-12 LAB — BASIC METABOLIC PANEL
Anion gap: 6 (ref 5–15)
BUN: 8 mg/dL (ref 6–20)
CHLORIDE: 107 mmol/L (ref 101–111)
CO2: 25 mmol/L (ref 22–32)
CREATININE: 0.74 mg/dL (ref 0.61–1.24)
Calcium: 9.1 mg/dL (ref 8.9–10.3)
GFR calc Af Amer: >60 mL/min (ref >60)
GFR calc non Af Amer: >60 mL/min (ref >60)
GLUCOSE: 114 mg/dL — AB (ref 65–99)
POTASSIUM: 3.4 mmol/L — AB (ref 3.5–5.1)
SODIUM: 138 mmol/L (ref 135–145)

## 2016-11-12 LAB — CBC
HCT: 41.5 % (ref 40.0–52.0)
HEMOGLOBIN: 14.4 g/dL (ref 13.0–18.0)
MCH: 32.2 pg (ref 26.0–34.0)
MCHC: 34.6 g/dL (ref 32.0–36.0)
MCV: 92.9 fL (ref 80.0–100.0)
Platelets: 202 10*3/uL (ref 150–440)
RBC: 4.47 MIL/uL (ref 4.40–5.90)
RDW: 14.2 % (ref 11.5–14.5)
WBC: 6.7 10*3/uL (ref 3.8–10.6)

## 2016-11-12 MED ORDER — PANTOPRAZOLE SODIUM 40 MG PO TBEC: 40.0000 mg | DELAYED_RELEASE_TABLET | Freq: Two times a day (BID) | ORAL | 0 refills | Status: DC

## 2016-11-12 MED ORDER — CIPROFLOXACIN HCL 0.3 % OP SOLN: 2.0000 [drp] | OPHTHALMIC | 0 refills | Status: DC

## 2016-11-12 MED ORDER — ENSURE ENLIVE PO LIQD
237.0000 mL | Freq: Three times a day (TID) | ORAL | 12 refills | Status: AC
Start: 2016-11-12 — End: ?

## 2016-11-12 MED ORDER — ADULT MULTIVITAMIN W/MINERALS CH: 1.0000 | ORAL_TABLET | Freq: Every day | ORAL | 0 refills | Status: DC

## 2016-11-12 NOTE — Progress Notes (Signed)
[facility] rounding unit visited with pt and pt's wife Susie who was at bedside. Pt and wife were reflective of the things they did in life. 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 Pt appeared exhausted, sleepy, could not talk much, but his wife continued to talk about concerns she had about her husband's health status. CH listened, provided spiritual support, prayer, and a ministry of presence. Pt will be discharged this PM.    11/12/16 1100  Clinical Encounter Type  Visited With Patient and family together  Visit Type Initial;Spiritual support  Referral From Chaplain  Spiritual Encounters  Spiritual Needs Prayer

## 2016-11-12 NOTE — Discharge Planning (Addendum)
Patient IV removed.  DC papers given and signed.  Patient Educated on expectations and rehab POC.  Report called to [employer], s/w [employer], LPN.  Scripts, DC papers and yellow DNR sheet in packet.  Told of needed FU appts and appts made.  EMS contacted to transport. Patinet will be going to room 404. Waiting on EMS arrival - currently 4th in line.

## 2016-11-12 NOTE — Clinical Social Work Placement (Signed)
   CLINICAL SOCIAL WORK PLACEMENT  NOTE  Date:  11/12/2016  Patient Details  Name: Joshua FellersJoe P Haskin MRN: [id number] Date of Birth: 12/04/1933  Clinical Social Work is seeking post-discharge placement for this patient at the Skilled  Nursing Facility level of care (*CSW will initial, date and re-position this form in  chart as items are completed):  Yes   Patient/family provided with [place] Clinical Social Work Department's list of facilities offering this level of care within the geographic area requested by the patient (or if unable, by the patient's family).  Yes   Patient/family informed of their freedom to choose among providers that offer the needed level of care, that participate in Medicare, Medicaid or managed care program needed by the patient, have an available bed and are willing to accept the patient.  Yes   Patient/family informed of 's ownership interest in [facility] and [facility], as well as of the fact that they are under no obligation to receive care at these facilities.  PASRR submitted to EDS on 11/11/16     PASRR number received on 11/11/16     Existing PASRR number confirmed on       FL2 transmitted to all facilities in geographic area requested by pt/family on 11/11/16     FL2 transmitted to all facilities within larger geographic area on       Patient informed that his/her managed care company has contracts with or will negotiate with certain facilities, including the following:        Yes   Patient/family informed of bed offers received.  Patient chooses bed at  [facility] )     Physician recommends and patient chooses bed at      Patient to be transferred to  [facility] ) on 11/12/16.  Patient to be transferred to facility by  [facility] )     Patient family notified on 11/12/16 of transfer.  Name of family member notified:   (Patient's wife Ronny BaconSusie Garside is at bedside anda ware of D/C today. )      PHYSICIAN       Additional Comment:    _______________________________________________ Corinthia Helmers, Darleen CrockerBailey M, LCSW 11/12/2016, 10:58 AM

## 2016-11-12 NOTE — Progress Notes (Signed)
New referral for Palliative to follow at [facility] received from CSW [employer] Hughes IncorporatedBailey Sample. Plan is for discharge today. Referral made aware. Thank you. Dayna BarkerKaren Robertson RN, BSN, [facility] [place], hospital Liaison [phone]

## 2016-11-12 NOTE — Discharge Summary (Addendum)
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 Dementia   . GERD (gastroesophageal reflux disease)     HOSPITAL COURSE:   This is an 83 year old male admitted for hematemesis. 1. Hematemesis: Hemoglobin stable. EGD shows reflux esophagitis  Change to oral PPI BID.   PPI for one month. 2. GERD: Continue PPIs as above 3. Dementia: Continue Namenda 4. DVT prophylaxis: SCDs 5. GI prophylaxis: As above 6. Eye irritaion     cipro eye drops.  DISCHARGE CONDITIONS:   Stable.  CONSULTS OBTAINED:  Treatment Team:  Tressia DanasBeavers, Kimberly, MD  DRUG ALLERGIES:  No Known Allergies  DISCHARGE MEDICATIONS:   Current Discharge Medication List    START taking these medications   Details  ciprofloxacin (CILOXAN) 0.3 % ophthalmic solution Place 2 drops into both eyes every 4 (four) hours while awake. Administer 1 drop, every 2 hours, while awake, for 2 days. Then 1 drop, every 4 hours, while awake, for the next 5 days. Qty: 5 mL, Refills: 0    feeding supplement, ENSURE ENLIVE, (ENSURE ENLIVE) LIQD Take 237 mLs by mouth 3 (three) times daily between meals. Qty: 237 mL, Refills: 12    Multiple Vitamin (MULTIVITAMIN WITH MINERALS) TABS tablet Take 1 tablet by mouth daily. Qty: 30 tablet, Refills: 0    pantoprazole (PROTONIX) 40 MG tablet Take 1 tablet (40 mg total) by mouth 2 (two) times daily before a meal. Qty: 60  tablet, Refills: 0      CONTINUE these medications which have NOT CHANGED   Details  docusate sodium (COLACE) 100 MG capsule Take 1 capsule (100 mg total) by mouth 2 (two) times daily. Qty: 60 capsule, Refills: 0    memantine (NAMENDA) 10 MG tablet 10 mg 2 (two) times daily.       STOP taking these medications     lactulose (CHRONULAC) 10 GM/15ML solution          DISCHARGE INSTRUCTIONS:    Follow with GI clinic in 2-3 weeks.  Palliative care nurse to follow at SNF.  If you experience worsening of your admission symptoms, develop shortness of breath, life threatening emergency, suicidal or homicidal thoughts you must seek medical attention immediately by calling 911 or calling your MD immediately  if symptoms less severe.  You Must read complete instructions/literature along with all the possible adverse reactions/side effects for all the Medicines you take and that have been prescribed to you. Take any new Medicines after you have completely understood and accept all the possible adverse reactions/side effects.   Please note  You were cared for by a hospitalist during your hospital stay. If you have any questions about your discharge medications or the care you received while you were in the hospital after you are discharged, you can call the unit and asked to speak with the hospitalist on call if the hospitalist that took care of you is not available. Once you are discharged,  your primary care physician will handle any further medical issues. Please note that NO REFILLS for any discharge medications will be authorized once you are discharged, as it is imperative that you return to your primary care physician (or establish a relationship with a primary care physician if you do not have one) for your aftercare needs so that they can reassess your need for medications and monitor your lab values.    Today   CHIEF COMPLAINT:   Chief Complaint  Patient presents with  .  Hematemesis    HISTORY OF PRESENT ILLNESS:  Joshua Vaughn  is a 83 y.o. male with a known history of GERD and dementia presents to the emergency department after 3 episodes of vomiting, the last of which was bloody. Due to dementia the patient is a poor historian. The patient's wife who is his primary caregiver states that she found him this morning with vomit on his face. He has had hematemesis before requiring cauterization of telangiectasias within the esophagus. The patient's first episode of vomiting this encounter was unwitnessed. The emergency department patient was placed on supplemental oxygen due to sats less than 90. Chest x-ray was clear and the patient was not in respiratory distress. He was placed on a Protonix drip prior to the emergency department staff called the hospitalist service for admission.   VITAL SIGNS:  Blood pressure 110/66, pulse 80, temperature 98.4 F (36.9 C), temperature source Oral, resp. rate 18, height 6\' 5"  (1.956 m), weight 76.3 kg (168 lb 3.4 oz), SpO2 96 %.  I/O:   Intake/Output Summary (Last 24 hours) at 11/12/16 0855 Last data filed at 11/12/16 0400  Gross per 24 hour  Intake              340 ml  Output                0 ml  Net              340 ml    PHYSICAL EXAMINATION:   GENERAL: Pleasant-appearing in no apparent distress.  HEAD, EYES, EARS, NOSE AND THROAT: Atraumatic, normocephalic. Extraocular muscles are intact. Pupils equal and reactive to light. Sclerae anicteric. No conjunctival injection. No oro-pharyngeal erythema.  NECK: Supple. There is no jugular venous distention. No bruits, no lymphadenopathy, no thyromegaly.  HEART: Regular rate and rhythm,. No murmurs, no rubs, no clicks.  LUNGS: Clear to auscultation bilaterally. No rales or rhonchi. No wheezes.  ABDOMEN: Soft, flat, nontender, nondistended. Has good bowel sounds. No hepatosplenomegaly appreciated.  EXTREMITIES: No evidence of any cyanosis, clubbing, or peripheral edema.  +2  pedal and radial pulses bilaterally.  NEUROLOGIC: The patient is alert, with no focal motor or sensory deficits appreciated bilaterally.  SKIN: Moist and warm with no rashes appreciated.  Psych: Not anxious, depressed LN: No inguinal LN enlargement  DATA REVIEW:   CBC  Recent Labs Lab 11/10/16 0316  WBC 7.1  HGB 12.8*  HCT 37.9*  PLT 169    Chemistries   Recent Labs Lab 11/07/16 2356 11/10/16 0316  NA 135 137  K 4.1 3.1*  CL 100* 109  CO2 27 24  GLUCOSE 144* 93  BUN 14 8  CREATININE 0.86 0.73  CALCIUM 9.5 8.3*  AST 29  --   ALT 20  --   ALKPHOS 60  --   BILITOT 1.8*  --     Cardiac Enzymes No results for input(s): TROPONINI in the last 168 hours.  Microbiology Results  Results for orders  placed or performed during the hospital encounter of 03/08/16  Culture, Urine     Status: None   Collection Time: 03/09/16 10:34 AM  Result Value Ref Range Status   Specimen Description URINE, RANDOM  Final   Special Requests NONE  Final   Culture NO GROWTH Performed at [facility]   Final   Report Status 03/10/2016 FINAL  Final    RADIOLOGY:  No results found.  EKG:   Orders placed or performed during the hospital encounter of 11/07/16  . EKG 12-Lead  . EKG 12-Lead      Management plans discussed with the patient, family and they are in agreement.  CODE STATUS:     Code Status Orders        Start     Ordered   11/08/16 0324  Do not attempt resuscitation (DNR)  Continuous    Question Answer Comment  In the event of cardiac or respiratory ARREST Do not call a "code blue"   In the event of cardiac or respiratory ARREST Do not perform Intubation, CPR, defibrillation or ACLS   In the event of cardiac or respiratory ARREST Use medication by any route, position, wound care, and other measures to relive pain and suffering. May use oxygen, suction and manual treatment of airway obstruction as needed for comfort.      11/08/16 0323    Code Status  History    Date Active Date Inactive Code Status Order ID Comments User Context   03/08/2016  7:27 PM 03/12/2016  3:56 PM DNR [id number]  Ramonita Lab, MD Inpatient   03/08/2016  6:32 PM 03/08/2016  7:27 PM Full Code [id number]  Ramonita Lab, MD ED    Advance Directive Documentation     Most Recent Value  Type of Advance Directive  Living will, Healthcare Power of Attorney  Pre-existing out of facility DNR order (yellow form or pink MOST form)  -  "MOST" Form in Place?  -      TOTAL TIME TAKING CARE OF THIS PATIENT: 35 minutes.    Altamese Dilling M.D on 11/12/2016 at 8:55 AM  Between 7am to 6pm - Pager - [phone]  After 6pm go to [email or web address] - password [email or web address]  [facility]  Office  [phone]  CC: Primary care physician; Danella Penton, MD   Note: This dictation was prepared with Dragon dictation along with smaller phrase technology. Any transcriptional errors that result from this process are unintentional.

## 2016-11-12 NOTE — Care Management Important Message (Signed)
Important Message  Patient Details  Name: Homero FellersJoe P Lozano MRN: [id number] Date of Birth: 04/16/1933   Medicare Important Message Given:  Yes    Marily MemosLisa M Ariz Terrones, RN 11/12/2016, 8:51 AM

## 2016-11-12 NOTE — Progress Notes (Signed)
Patient is medically stable for D/C to [facility]. [facility] at [facility] patient can come today to room 404. RN will call report to 400 hall nurse and arrange EMS for transport. Clinical Child psychotherapistocial Worker (CSW) sent D/C orders to [facility] via [facility]. Patient's wife Lynnell DikeSusie is at bedside and aware of above. Please reconsult if future social work needs arise. CSW signing off.   B[employer] Hughes IncorporatedBailey Jaelen Soth, LCSW [phone]

## 2016-12-09 ENCOUNTER — Ambulatory Visit: Payer: Medicare Other | Admitting: Gastroenterology

## 2017-01-05 NOTE — Progress Notes (Signed)
New PT note to include G codes.      11/14/2016 1025  PT G-Codes **NOT FOR INPATIENT CLASS**  Functional Assessment Tool Used AM-PAC 6 Clicks Basic Mobility;Clinical judgement  Functional Limitation Mobility: Walking and moving around  Mobility: Walking and Moving Around Current Status (R6045) CN  Mobility: Walking and Moving Around Goal Status (W0981) CL   Encarnacion Chu PT, DPT

## 2017-06-22 ENCOUNTER — Emergency Department: Payer: Medicare Other

## 2017-06-22 ENCOUNTER — Inpatient Hospital Stay: Payer: Medicare Other

## 2017-06-22 ENCOUNTER — Other Ambulatory Visit: Payer: Self-pay

## 2017-06-22 ENCOUNTER — Inpatient Hospital Stay
Admission: EM | Admit: 2017-06-22 | Discharge: 2017-06-24 | DRG: 871 | Disposition: A | Discharge status: Hospice | Payer: Medicare Other | Attending: Internal Medicine | Admitting: Internal Medicine

## 2017-06-22 DIAGNOSIS — R509 Fever, unspecified: Secondary | ICD-10-CM | POA: Diagnosis not present

## 2017-06-22 DIAGNOSIS — A419 Sepsis, unspecified organism: Principal | ICD-10-CM

## 2017-06-22 DIAGNOSIS — K8000 Calculus of gallbladder with acute cholecystitis without obstruction: Secondary | ICD-10-CM | POA: Diagnosis present

## 2017-06-22 DIAGNOSIS — Z79899 Other long term (current) drug therapy: Secondary | ICD-10-CM | POA: Diagnosis not present

## 2017-06-22 DIAGNOSIS — K810 Acute cholecystitis: Secondary | ICD-10-CM | POA: Diagnosis present

## 2017-06-22 DIAGNOSIS — K210 Gastro-esophageal reflux disease with esophagitis: Secondary | ICD-10-CM | POA: Diagnosis present

## 2017-06-22 DIAGNOSIS — Z66 Do not resuscitate: Secondary | ICD-10-CM | POA: Diagnosis present

## 2017-06-22 DIAGNOSIS — Z87891 Personal history of nicotine dependence: Secondary | ICD-10-CM

## 2017-06-22 DIAGNOSIS — J9601 Acute respiratory failure with hypoxia: Secondary | ICD-10-CM | POA: Diagnosis present

## 2017-06-22 DIAGNOSIS — F0390 Unspecified dementia without behavioral disturbance: Secondary | ICD-10-CM | POA: Diagnosis present

## 2017-06-22 DIAGNOSIS — R7989 Other specified abnormal findings of blood chemistry: Secondary | ICD-10-CM

## 2017-06-22 DIAGNOSIS — R17 Unspecified jaundice: Secondary | ICD-10-CM

## 2017-06-22 DIAGNOSIS — R945 Abnormal results of liver function studies: Secondary | ICD-10-CM

## 2017-06-22 LAB — COMPREHENSIVE METABOLIC PANEL
ALK PHOS: 100 U/L (ref 38–126)
ALK PHOS: 78 U/L (ref 38–126)
ALT: 106 U/L — ABNORMAL HIGH (ref 17–63)
ALT: 88 U/L — AB (ref 17–63)
AST: 77 U/L — AB (ref 15–41)
AST: 72 U/L — AB (ref 15–41)
Albumin: 3.1 g/dL — ABNORMAL LOW (ref 3.5–5.0)
Albumin: 2.6 g/dL — ABNORMAL LOW (ref 3.5–5.0)
Anion gap: 11 (ref 5–15)
Anion gap: 6 (ref 5–15)
BILIRUBIN TOTAL: 2.3 mg/dL — AB (ref 0.3–1.2)
BILIRUBIN TOTAL: 1.5 mg/dL — AB (ref 0.3–1.2)
BUN: 12 mg/dL (ref 6–20)
BUN: 10 mg/dL (ref 6–20)
CALCIUM: 8.5 mg/dL — AB (ref 8.9–10.3)
CALCIUM: 7.9 mg/dL — AB (ref 8.9–10.3)
CHLORIDE: 108 mmol/L (ref 101–111)
CO2: 22 mmol/L (ref 22–32)
CO2: 23 mmol/L (ref 22–32)
CREATININE: 0.77 mg/dL (ref 0.61–1.24)
CREATININE: 0.64 mg/dL (ref 0.61–1.24)
Chloride: 104 mmol/L (ref 101–111)
GFR calc Af Amer: >60 mL/min (ref >60)
GLUCOSE: 119 mg/dL — AB (ref 65–99)
Glucose, Bld: 113 mg/dL — ABNORMAL HIGH (ref 65–99)
Potassium: 3.6 mmol/L (ref 3.5–5.1)
Potassium: 3.1 mmol/L — ABNORMAL LOW (ref 3.5–5.1)
Sodium: 137 mmol/L (ref 135–145)
Sodium: 137 mmol/L (ref 135–145)
TOTAL PROTEIN: 6.9 g/dL (ref 6.5–8.1)
Total Protein: 5.7 g/dL — ABNORMAL LOW (ref 6.5–8.1)

## 2017-06-22 LAB — TSH: TSH: 2.109 u[IU]/mL (ref 0.350–4.500)

## 2017-06-22 LAB — CBC WITH DIFFERENTIAL/PLATELET
Basophils Absolute: 0 10*3/uL (ref 0–0.1)
Basophils Relative: 0 %
EOS PCT: 1 %
Eosinophils Absolute: 0.1 10*3/uL (ref 0–0.7)
HCT: 42.4 % (ref 40.0–52.0)
Hemoglobin: 14.6 g/dL (ref 13.0–18.0)
LYMPHS ABS: 0.9 10*3/uL — AB (ref 1.0–3.6)
Lymphocytes Relative: 13 %
MCH: 31.4 pg (ref 26.0–34.0)
MCHC: 34.6 g/dL (ref 32.0–36.0)
MCV: 90.9 fL (ref 80.0–100.0)
MONO ABS: 0.5 10*3/uL (ref 0.2–1.0)
MONOS PCT: 7 %
Neutro Abs: 5.5 10*3/uL (ref 1.4–6.5)
Neutrophils Relative %: 79 %
PLATELETS: 152 10*3/uL (ref 150–440)
RBC: 4.66 MIL/uL (ref 4.40–5.90)
RDW: 14.2 % (ref 11.5–14.5)
WBC: 7 10*3/uL (ref 3.8–10.6)

## 2017-06-22 LAB — PROCALCITONIN: Procalcitonin: 2.01 ng/mL

## 2017-06-22 LAB — URINALYSIS, COMPLETE (UACMP) WITH MICROSCOPIC
Bacteria, UA: NONE SEEN
Bilirubin Urine: NEGATIVE
Glucose, UA: NEGATIVE mg/dL
KETONES UR: NEGATIVE mg/dL
Leukocytes, UA: NEGATIVE
Nitrite: NEGATIVE
PROTEIN: NEGATIVE mg/dL
SQUAMOUS EPITHELIAL / LPF: NONE SEEN
Specific Gravity, Urine: 1.008 (ref 1.005–1.030)
pH: 6 (ref 5.0–8.0)

## 2017-06-22 LAB — PROTIME-INR
INR: 1.04
Prothrombin Time: 13.5 seconds (ref 11.4–15.2)

## 2017-06-22 LAB — LIPASE, BLOOD: LIPASE: 26 U/L (ref 11–51)

## 2017-06-22 LAB — INFLUENZA PANEL BY PCR (TYPE A & B)
INFLBPCR: NEGATIVE
Influenza A By PCR: NEGATIVE

## 2017-06-22 LAB — LACTIC ACID, PLASMA
Lactic Acid, Venous: 3.6 mmol/L (ref 0.5–1.9)
Lactic Acid, Venous: 1.5 mmol/L (ref 0.5–1.9)

## 2017-06-22 LAB — TROPONIN I: Troponin I: <0.03 ng/mL (ref <0.03)

## 2017-06-22 LAB — BRAIN NATRIURETIC PEPTIDE: B Natriuretic Peptide: 39 pg/mL (ref 0.0–100.0)

## 2017-06-22 MED ORDER — IOPAMIDOL (ISOVUE-300) INJECTION 61%
100.0000 mL | Freq: Once | INTRAVENOUS | Status: AC | PRN
  Administered 2017-06-22: 100 mL via INTRAVENOUS

## 2017-06-22 MED ORDER — SODIUM CHLORIDE 0.9 % IV SOLN
INTRAVENOUS | Status: DC
  Administered 2017-06-22 (×4): via INTRAVENOUS

## 2017-06-22 MED ORDER — SODIUM CHLORIDE 0.9 % IV BOLUS (SEPSIS)
1000.0000 mL | Freq: Once | INTRAVENOUS | Status: AC
  Administered 2017-06-22: 07:00:00 1000 mL via INTRAVENOUS

## 2017-06-22 MED ORDER — ADULT MULTIVITAMIN W/MINERALS CH
1.0000 | ORAL_TABLET | Freq: Every day | ORAL | Status: DC
  Administered 2017-06-23 – 2017-06-24 (×2): 1 via ORAL
  Filled 2017-06-22 (×2): qty 1

## 2017-06-22 MED ORDER — ACETAMINOPHEN 325 MG PO TABS
650.0000 mg | ORAL_TABLET | Freq: Four times a day (QID) | ORAL | Status: DC | PRN
  Administered 2017-06-22 – 2017-06-23 (×2): 650 mg via ORAL
  Filled 2017-06-22 (×2): qty 2

## 2017-06-22 MED ORDER — PIPERACILLIN-TAZOBACTAM 3.375 G IVPB
3.3750 g | Freq: Three times a day (TID) | INTRAVENOUS | Status: DC
  Administered 2017-06-22 – 2017-06-24 (×7): 3.375 g via INTRAVENOUS
  Filled 2017-06-22 (×8): qty 50

## 2017-06-22 MED ORDER — MEMANTINE HCL 5 MG PO TABS
10.0000 mg | ORAL_TABLET | Freq: Two times a day (BID) | ORAL | Status: DC
  Administered 2017-06-23 – 2017-06-24 (×3): 10 mg via ORAL
  Filled 2017-06-22 (×3): qty 2

## 2017-06-22 MED ORDER — VANCOMYCIN HCL 10 G IV SOLR
1250.0000 mg | Freq: Two times a day (BID) | INTRAVENOUS | Status: DC
  Administered 2017-06-22 (×2): 1250 mg via INTRAVENOUS
  Filled 2017-06-22 (×3): qty 1250

## 2017-06-22 MED ORDER — ONDANSETRON HCL 4 MG/2ML IJ SOLN: 4.0000 mg | Freq: Four times a day (QID) | INTRAMUSCULAR | Status: DC | PRN

## 2017-06-22 MED ORDER — PANTOPRAZOLE SODIUM 40 MG PO TBEC
40.0000 mg | DELAYED_RELEASE_TABLET | Freq: Two times a day (BID) | ORAL | Status: DC
  Administered 2017-06-22 – 2017-06-24 (×4): 40 mg via ORAL
  Filled 2017-06-22 (×4): qty 1

## 2017-06-22 MED ORDER — DOCUSATE SODIUM 100 MG PO CAPS
100.0000 mg | ORAL_CAPSULE | Freq: Two times a day (BID) | ORAL | Status: DC
  Administered 2017-06-22: 21:00:00 100 mg via ORAL
  Filled 2017-06-22 (×2): qty 1

## 2017-06-22 MED ORDER — PIPERACILLIN-TAZOBACTAM 3.375 G IVPB 30 MIN
3.3750 g | Freq: Once | INTRAVENOUS | Status: AC
  Administered 2017-06-22: 3.375 g via INTRAVENOUS
  Filled 2017-06-22: qty 50

## 2017-06-22 MED ORDER — VANCOMYCIN HCL IN DEXTROSE 1-5 GM/200ML-% IV SOLN
1000.0000 mg | Freq: Once | INTRAVENOUS | Status: AC
  Administered 2017-06-22: 1000 mg via INTRAVENOUS
  Filled 2017-06-22: qty 200

## 2017-06-22 MED ORDER — ACETAMINOPHEN 650 MG RE SUPP: 650.0000 mg | Freq: Four times a day (QID) | RECTAL | Status: DC | PRN

## 2017-06-22 MED ORDER — ENOXAPARIN SODIUM 40 MG/0.4ML ~~LOC~~ SOLN
40.0000 mg | SUBCUTANEOUS | Status: DC
  Administered 2017-06-22 – 2017-06-23 (×2): 40 mg via SUBCUTANEOUS
  Filled 2017-06-22 (×2): qty 0.4

## 2017-06-22 MED ORDER — ACETAMINOPHEN 650 MG RE SUPP
RECTAL | Status: AC
  Filled 2017-06-22: qty 1

## 2017-06-22 MED ORDER — SODIUM CHLORIDE 0.9 % IV BOLUS (SEPSIS)
1000.0000 mL | Freq: Once | INTRAVENOUS | Status: AC
  Administered 2017-06-22: 1000 mL via INTRAVENOUS

## 2017-06-22 MED ORDER — ACETAMINOPHEN 650 MG RE SUPP
650.0000 mg | Freq: Once | RECTAL | Status: AC
  Administered 2017-06-22: 650 mg via RECTAL

## 2017-06-22 MED ORDER — ONDANSETRON HCL 4 MG PO TABS: 4.0000 mg | ORAL_TABLET | Freq: Four times a day (QID) | ORAL | Status: DC | PRN

## 2017-06-22 MED ORDER — TAB-A-VITE/IRON PO TABS: 1.0000 | ORAL_TABLET | Freq: Every day | ORAL | Status: DC

## 2017-06-22 NOTE — Progress Notes (Signed)
SURGICAL PROGRESS NOTE  Hospital Day(s): 0.   Interval History: Patient seen and examined, no acute events or new complaints since admission overnight. Patient's family report his mental status returned to baseline mid-morning, though he's been sleeping more than usual, denies any abdominal pain, N/V, CP, or SOB, and tolerated a regular diet following speech/swallow evaluation earlier today. Patient's wife reports patient sometimes says "ouch" when she turns him in bed, but she recalls he's been doing that for 4 - 5 years.  Vital signs in last 24 hours: [min-max] current  Temp:  [98.2 F (36.8 C)-101.2 F (38.4 C)] 98.5 F (36.9 C) (03/11 1436) Pulse Rate:  [63-108] 63 (03/11 1436) Resp:  [18-37] 20 (03/11 1436) BP: (90-127)/(50-72) 117/53 (03/11 1436) SpO2:  [90 %-100 %] 100 % (03/11 1436) Weight:  [156 lb 11.2 oz (71.1 kg)-190 lb (86.2 kg)] 156 lb 11.2 oz (71.1 kg) (03/11 0510)     Height: 6\' 2"  (188 cm) Weight: 156 lb 11.2 oz (71.1 kg) BMI (Calculated): 20.11   Intake/Output this shift:  Total I/O In: 0  Out: 900 [Urine:900]   Intake/Output last 2 shifts:  @IOLAST2SHIFTS @   Physical Exam:  Constitutional: resting comfortably, no distress  HENT: normocephalic without obvious abnormality  Eyes: PERRL, sclera anicteric Respiratory: breathing non-labored at rest with 2L oxygen via [place] Cardiovascular: regular rate and sinus rhythm  Gastrointestinal: soft, completely non-tender without any grimacing or otherwise, and non-distended  Labs:  CBC Latest Ref Rng & Units 06/22/2017 11/12/2016 11/10/2016  WBC 3.8 - 10.6 K/uL 7.0 6.7 7.1  Hemoglobin 13.0 - 18.0 g/dL 16.114.6 09.614.4 12.8(L)  Hematocrit 40.0 - 52.0 % 42.4 41.5 37.9(L)  Platelets 150 - 440 K/uL 152 202 169   CMP Latest Ref Rng & Units 06/22/2017 11/12/2016 11/10/2016  Glucose 65 - 99 mg/dL 045(W119(H) 098(J114(H) 93  BUN 6 - 20 mg/dL 12 8 8   Creatinine 0.61 - 1.24 mg/dL 1.910.77 4.780.74 2.950.73  Sodium 135 - 145 mmol/L 137 138 137  Potassium 3.5 -  5.1 mmol/L 3.6 3.4(L) 3.1(L)  Chloride 101 - 111 mmol/L 104 107 109  CO2 22 - 32 mmol/L 22 25 24   Calcium 8.9 - 10.3 mg/dL 6.2(Z8.5(L) 9.1 3.0(Q8.3(L)  Total Protein 6.5 - 8.1 g/dL 6.9 - -  Total Bilirubin 0.3 - 1.2 mg/dL 2.3(H) - -  Alkaline Phos 38 - 126 U/L 100 - -  AST 15 - 41 U/L 77(H) - -  ALT 17 - 63 U/L 106(H) - -   Total Bilirubin: 2.3 (06/22/2017) <-- 1.8 (11/07/2016) <-- 2.4 (03/10/2016) <-- 5.0 (03/09/2016) <-- 5.1 (03/08/2016)  Imaging studies:  CT Abdomen and Pelvis with Contrast (06/22/2017) - personally reviewed with patient's family bedside 1. Vague soft tissue inflammation about the relatively decompressed gallbladder. Acute cholecystitis is a concern. Would correlate for associated symptoms. Mild prominence of the intrahepatic biliary ducts. 2. Apparent bilateral bladder wall thickening is nonspecific. Cystoscopy could be considered for further evaluation, if deemed clinically appropriate. 3. Diffuse coronary artery calcifications. Mitral valve calcification. 4. Bibasilar atelectasis.  Assessment/Plan: (ICD-10's: R50.9, E80.6) 84 y.o. male with resolved fever to 101.2 and shaking overnight, none since without acetaminophen or other antipyretic documented today, transient hypoxia attributed to hyperventilation, chronic hyperbilirubinemia, cholelithiasis and "vague soft tissue inflammation about relatively decompressed gallbladder" without any acute RUQ abdominal pain/tenderness, and a normal WBC, having tolerated regular diet earlier today, complicated by pertinent comorbidities including advanced dementia, advanced chronological age, and GERD.   - no surgical intervention at this time  - monitor  abdominal exam and bowel function  - IV fluids, antibiotics, and medical management per primary medical team   - repeat/trend and fractionate total bilirubin tomorrow morning   - DVT prophylaxis  All of the above findings and recommendations were discussed at length with patient's  family, and all of patient's family's questions were answered to their expressed satisfaction.  Thank you for the opportunity to participate in this patient's care.  -- Scherrie Gerlach Earlene Plater, MD, RPVI [place]: [facility] General [facility] - Partnering for exceptional care. Office: [phone]

## 2017-06-22 NOTE — ED Notes (Signed)
Dr York CeriseForbach in with pt wife to discuss care.

## 2017-06-22 NOTE — Evaluation (Signed)
Clinical/Bedside Swallow Evaluation Patient Details  Name: Joshua Vaughn MRN: [id number] Date of Birth: Aug 16, 1933  Today's Date: 06/22/2017 Time: SLP Start Time (ACUTE ONLY): 0950 SLP Stop Time (ACUTE ONLY): 1050 SLP Time Calculation (min) (ACUTE ONLY): 60 min  Past Medical History:  Past Medical History:  Diagnosis Date  . Dementia   . GERD (gastroesophageal reflux disease)    Past Surgical History:  Past Surgical History:  Procedure Laterality Date  . ESOPHAGOGASTRODUODENOSCOPY Left 11/10/2016   Procedure: ESOPHAGOGASTRODUODENOSCOPY (EGD);  Surgeon: Wyline Mood, MD;  Location: [facility] ENDOSCOPY;  Service: Gastroenterology;  Laterality: Left;  . ESOPHAGOGASTRODUODENOSCOPY (EGD) WITH PROPOFOL N/A 03/10/2016   Procedure: ESOPHAGOGASTRODUODENOSCOPY (EGD) WITH PROPOFOL;  Surgeon: Scot Jun, MD;  Location: [facility] ENDOSCOPY;  Service: Endoscopy;  Laterality: N/A;   HPI:  Pt with past medical history of Alzheimer's Dementia and GERD presents to the emergency department via EMS after being found at home with rigors and fever.  The patient's primary care doctor prescribed antibiotics for suspected infection.  His wife thought that he may be having an allergic reaction but further evaluation revealed that the patient was hypoxic to 82% on room air and that he was not moving a lot of air during respiration.  He was placed on nonrebreather mask which improved his oxygen saturations.  In the emergency department CT of his abdomen revealed cholecystitis which prompted the emergency department staff to call the hospitalist service for admission. Pt resides at home w/ family; requires assistance for ADLs.    Assessment / Plan / Recommendation Clinical Impression  Pt appears at min increased risk for aspiration secondary to mild oral phase dysphagia as well as declined Cognitive status (alzheimer's dementia) baseline. Pt consumed po trials of ice chips, thin liquids via Cup, and purees w/ no immediate,  overt s/s of aspiration noted - no decline in vocal quality or respiratory status post trials. Pt exhibited min oral phase deficits c/b brief oral holding of liquid boluses and overall decreased Cognitive awareness of po tasks w/out cues. Pt was able to help hold cup(w/ SLP) to drink liquids but moderate verbal/tactile/visual cues were necessary during task. Pt wears dentures baseline for eating solids per family but his not wearing them currently - discussed this w/ family and recommended pt practice wearing them next 1-2 days. Recommend a dysphagia level 1(puree) w/ thin liquids w/ strict aspiration precautions; feeding support at meals; Pills in Puree. Reduce distractions at meals and give full cues for attention to task. ST services will f/u w/ toleration of diet, education w/ family, and trials to ugprade if appropriate. This diet consistency may need to continue based on pt's Cognitive presentation until he is improved overall.  SLP Visit Diagnosis: Dysphagia, oropharyngeal phase (R13.12)    Aspiration Risk  Mild aspiration risk    Diet Recommendation  Dysphagia level 1(puree) w/ thin liquids via CUP - No Straw. Aspiration precautions; feeding support at meals.   Medication Administration: Crushed with puree(as able and needed d/t Cognitive decline)    Other  Recommendations Recommended Consults: (Dietician f/u) Oral Care Recommendations: Oral care BID;Staff/trained caregiver to provide oral care Other Recommendations: (not currently)   Follow up Recommendations Skilled Nursing facility      Frequency and Duration min 3x week  2 weeks       Prognosis Prognosis for Safe Diet Advancement: Fair Barriers to Reach Goals: Cognitive deficits;Severity of deficits      Swallow Study   General Date of Onset: 06/22/17 HPI: Pt with past medical  history of Alzheimer's Dementia and GERD presents to the emergency department via EMS after being found at home with rigors and fever.  The patient's  primary care doctor prescribed antibiotics for suspected infection.  His wife thought that he may be having an allergic reaction but further evaluation revealed that the patient was hypoxic to 82% on room air and that he was not moving a lot of air during respiration.  He was placed on nonrebreather mask which improved his oxygen saturations.  In the emergency department CT of his abdomen revealed cholecystitis which prompted the emergency department staff to call the hospitalist service for admission. Pt resides at home w/ family; requires assistance for ADLs.  Type of Study: Bedside Swallow Evaluation Previous Swallow Assessment: none reported Diet Prior to this Study: Thin liquids(clear liquids diet) Temperature Spikes Noted: Yes(wbc 7.0;  temp 101.2) Respiratory Status: Nasal cannula(2 liters) History of Recent Intubation: No Behavior/Cognition: Cooperative;Pleasant mood;Distractible;Requires cueing;Confused(baseline Dementia; awake) Oral Cavity Assessment: Dry Oral Care Completed by SLP: Recent completion by staff Oral Cavity - Dentition: Edentulous(has U/L dentures but not wearing them) Vision: (n/a) Self-Feeding Abilities: Needs assist;Needs set up;Total assist(can help to hold cup) Patient Positioning: Upright in bed Baseline Vocal Quality: Low vocal intensity(muttered/mumbled words) Volitional Cough: Cognitively unable to elicit Volitional Swallow: Unable to elicit    Oral/Motor/Sensory Function Overall Oral Motor/Sensory Function: (poor follow through; grossly wfl w/ bolus management)   Ice Chips Ice chips: Within functional limits Presentation: Spoon(fed; 5 trials)   Thin Liquid Thin Liquid: Impaired Presentation: Cup;Self Fed(fully assisted; 9 trials - helped to hold cup w/ SLP) Oral Phase Impairments: Reduced labial seal(reduced awareness w/ task overall) Oral Phase Functional Implications: Oral holding(x1-2) Pharyngeal  Phase Impairments: (none)    Nectar Thick Nectar Thick  Liquid: Not tested   Honey Thick Honey Thick Liquid: Not tested   Puree Puree: Within functional limits Presentation: Spoon(fed; 8 trials)   Solid   GO   Solid: Not tested         Jerilynn SomKatherine Watson, MS, CCC-SLP Watson,Katherine 06/22/2017,1:37 PM

## 2017-06-22 NOTE — Progress Notes (Addendum)
[facility] NAME: Joshua Vaughn    MR#:  [id number]  DATE OF BIRTH:  May 29, 1933  SUBJECTIVE:  CHIEF COMPLAINT: Patient denies any abdominal pain, arousable and tolerating food Patient has chronic dementia at his baseline.  Wife and daughter at bedside  REVIEW OF SYSTEMS:  Review of system unobtainable as the patient is demented  DRUG ALLERGIES:  No Known Allergies  VITALS:  Blood pressure (!) 117/53, pulse 63, temperature 98.5 F (36.9 C), temperature source Oral, resp. rate 20, height _0  (1.88 m), weight 71.1 kg (156 lb 11.2 oz), SpO2 100 %.  PHYSICAL EXAMINATION:  GENERAL:  84 y.o.-year-old patient lying in the bed with no acute distress.  EYES: Pupils equal, round, reactive to light and accommodation. No scleral icterus. Extraocular muscles intact.  HEENT: Head atraumatic, normocephalic. Oropharynx and nasopharynx clear.  NECK:  Supple, no jugular venous distention. No thyroid enlargement, no tenderness.  LUNGS: Normal breath sounds bilaterally, no wheezing, rales,rhonchi or crepitation. No use of accessory muscles of respiration.  CARDIOVASCULAR: S1, S2 normal. No murmurs, rubs, or gallops.  ABDOMEN: Soft, nontender, nondistended. Bowel sounds present.  EXTREMITIES: No pedal edema, cyanosis, or clubbing.  NEUROLOGIC: Cranial nerves II through XII are intact. Sensation intact. Gait not checked.  PSYCHIATRIC: The patient is alert and oriented x 1.  Chronic dementia SKIN: No obvious rash, lesion, or ulcer.    LABORATORY PANEL:   CBC Recent Labs  Lab 06/22/17 0101  WBC 7.0  HGB 14.6  HCT 42.4  PLT 152   ------------------------------------------------------------------------------------------------------------------  Chemistries  Recent Labs  Lab 06/22/17 0101  NA 137  K 3.6  CL 104  CO2 22  GLUCOSE 119*  BUN 12  CREATININE 0.77  CALCIUM 8.5*  AST 77*  ALT 106*  ALKPHOS 100  BILITOT 2.3*    ------------------------------------------------------------------------------------------------------------------  Cardiac Enzymes Recent Labs  Lab 06/22/17 0101  TROPONINI <0.03   ------------------------------------------------------------------------------------------------------------------  RADIOLOGY:  Ct Abdomen Pelvis W Contrast  Result Date: 06/22/2017 CLINICAL DATA:  Acute onset of generalized abdominal pain.  Sepsis. EXAM: CT ABDOMEN AND PELVIS WITH CONTRAST TECHNIQUE: Multidetector CT imaging of the abdomen and pelvis was performed using the standard protocol following bolus administration of intravenous contrast. CONTRAST:  17m ISOVUE-300 IOPAMIDOL (ISOVUE-300) INJECTION 61% COMPARISON:  CT of the abdomen and pelvis performed 03/08/2016, and abdominal ultrasound performed 03/09/2016 FINDINGS: Lower chest: Bibasilar atelectasis is noted. Diffuse coronary artery calcifications are seen. Mitral valve calcification is also noted. Hepatobiliary: Small stones are seen within the gallbladder. Vague soft tissue inflammation is noted about the relatively decompressed gallbladder; acute cholecystitis is a concern. There is mild prominence of the intrahepatic biliary ducts. The liver is otherwise unremarkable in appearance. The common bile duct remains normal in caliber. Pancreas: The pancreas is within normal limits. Spleen: The spleen is unremarkable in appearance. Adrenals/Urinary Tract: The adrenal glands are unremarkable in appearance. The kidneys are within normal limits. There is no evidence of hydronephrosis. No renal or ureteral stones are identified. No perinephric stranding is seen. Stomach/Bowel: The stomach is unremarkable in appearance. The small bowel is within normal limits. The appendix is normal in caliber, without evidence of appendicitis. The colon is unremarkable in appearance. Vascular/Lymphatic: The abdominal aorta is unremarkable in appearance. The inferior vena cava is  grossly unremarkable. No retroperitoneal lymphadenopathy is seen. No pelvic sidewall lymphadenopathy is identified. Reproductive: The bladder is decompressed, with a Foley catheter in place. Apparent bilateral bladder wall thickening is nonspecific. The  prostate is borderline normal in size. Other: No additional soft tissue abnormalities are seen. Musculoskeletal: No acute osseous abnormalities are identified. The visualized musculature is unremarkable in appearance. IMPRESSION: 1. Vague soft tissue inflammation about the relatively decompressed gallbladder. Acute cholecystitis is a concern. Would correlate for associated symptoms. Mild prominence of the intrahepatic biliary ducts. 2. Apparent bilateral bladder wall thickening is nonspecific. Cystoscopy could be considered for further evaluation, if deemed clinically appropriate. 3. Diffuse coronary artery calcifications. Mitral valve calcification. 4. Bibasilar atelectasis. Electronically Signed   By: Garald Balding M.D.   On: 06/22/2017 04:14   Dg Chest Port 1 View  Result Date: 06/22/2017 CLINICAL DATA:  84 year old male with sepsis. EXAM: PORTABLE CHEST 1 VIEW COMPARISON:  Chest radiograph dated 11/08/2016 FINDINGS: Shallow inspiration. No focal consolidation, pleural effusion, or pneumothorax. The cardiac silhouette is within normal limits. No acute osseous pathology. IMPRESSION: No active disease. Electronically Signed   By: Anner Crete M.D.   On: 06/22/2017 02:06    EKG:   Orders placed or performed during the hospital encounter of 06/22/17  . EKG 12-Lead  . EKG 12-Lead  . EKG 12-Lead  . EKG 12-Lead  . EKG 12-Lead  . EKG 12-Lead    ASSESSMENT AND PLAN:    This is an 84 year old male admitted for sepsis.  1.  Sepsis: Source of infection is probably acute cholecystitis The patient met criteria via fever, tachycardia and intermittent tachypnea.  Procalcitonin is elevated as well as lactic acid.    Continue to hydrate aggressively.    Follow urine and blood cultures for growth and sensitivities.    Continue Vanco and Zosyn.  2.  Cholecystitis: Evaluated by surgery.  The patient is not a surgical candidate at this time.  Continue antimicrobial therapy, if no improvement with conservative measures, might consider percutaneous cholecystostomy  3.  Dementia: Continue Namenda  4.  DVT prophylaxis: Lovenox  5.  GI prophylaxis: None      All the records are reviewed and case discussed with Care Management/Social Workerr. Management plans discussed with the patient, family and they are in agreement.  CODE STATUS: DNR  TOTAL TIME TAKING CARE OF THIS PATIENT: 35 minutes.   POSSIBLE D/C IN 2 DAYS, DEPENDING ON CLINICAL CONDITION.  Note: This dictation was prepared with Dragon dictation along with smaller phrase technology. Any transcriptional errors that result from this process are unintentional.   Nicholes Mango M.D on 06/22/2017 at 3:17 PM  Between 7am to 6pm - Pager - [phone] After 6pm go to [email or web address] - password [email or web address] [facility]  Office  [phone]  CC: Primary care physician; Rusty Aus, MD

## 2017-06-22 NOTE — Care Management (Addendum)
Patient is followed by [facility] in the home under diagnosis of alzheimer's. Patient admitted with sepsis with cholecystitis.  Per surgery- not a surgery candidate. Notified agency of admission and faxed h/p and consult .  Patient is not on oxygen at home

## 2017-06-22 NOTE — H&P (Signed)
Joshua Vaughn is an 84 y.o. male.   Chief Complaint: Fever HPI: The patient with past medical history of dementia presents to the emergency department via EMS after being found at home with rigors and fever.  The patient's primary care doctor prescribed antibiotics for suspected infection.  His wife thought that he may be having an allergic reaction but further evaluation revealed that the patient was hypoxic to 82% on room air and that he was not moving a lot of air during respiration.  He was placed on nonrebreather mask which improved his oxygen saturations.  In the emergency department CT of his abdomen revealed cholecystitis which prompted the emergency department staff to call the hospitalist service for admission.  Past Medical History:  Diagnosis Date  . Dementia   . GERD (gastroesophageal reflux disease)     Past Surgical History:  Procedure Laterality Date  . ESOPHAGOGASTRODUODENOSCOPY Left 11/10/2016   Procedure: ESOPHAGOGASTRODUODENOSCOPY (EGD);  Surgeon: Joshua Bellows, MD;  Location: [facility] ENDOSCOPY;  Service: Gastroenterology;  Laterality: Left;  . ESOPHAGOGASTRODUODENOSCOPY (EGD) WITH PROPOFOL N/A 03/10/2016   Procedure: ESOPHAGOGASTRODUODENOSCOPY (EGD) WITH PROPOFOL;  Surgeon: Joshua Silvas, MD;  Location: [facility] ENDOSCOPY;  Service: Endoscopy;  Laterality: N/A;    Family History  Problem Relation Age of Onset  . Dementia Mother   . Cancer Father    Social History:  reports that he has quit smoking. he has never used smokeless tobacco. He reports that he does not drink alcohol or use drugs.  Allergies: No Known Allergies  Medications Prior to Admission  Medication Sig Dispense Refill  . docusate sodium (COLACE) 100 MG capsule Take 1 capsule (100 mg total) by mouth 2 (two) times daily. 60 capsule 0  . feeding supplement, ENSURE ENLIVE, (ENSURE ENLIVE) LIQD Take 237 mLs by mouth 3 (three) times daily between meals. 237 mL 12  . memantine (NAMENDA) 10 MG tablet 10 mg 2 (two)  times daily.     . Multiple Vitamin (MULTIVITAMIN WITH MINERALS) TABS tablet Take 1 tablet by mouth daily. 30 tablet 0  . pantoprazole (PROTONIX) 40 MG tablet Take 1 tablet (40 mg total) by mouth 2 (two) times daily before a meal. 60 tablet 0    Results for orders placed or performed during the hospital encounter of 06/22/17 (from the past 48 hour(s))  Lactic acid, plasma     Status: Abnormal   Collection Time: 06/22/17  1:01 AM  Result Value Ref Range   Lactic Acid, Venous 3.6 (HH) 0.5 - 1.9 mmol/L    Comment: CRITICAL RESULT CALLED TO, READ BACK BY AND VERIFIED WITH Joshua Vaughn AT 0150 06/22/17.PMH Performed at [facility], [address] [place], [place] [address]   Comprehensive metabolic panel     Status: Abnormal   Collection Time: 06/22/17  1:01 AM  Result Value Ref Range   Sodium 137 135 - 145 mmol/L   Potassium 3.6 3.5 - 5.1 mmol/L   Chloride 104 101 - 111 mmol/L   CO2 22 22 - 32 mmol/L   Glucose, Bld 119 (H) 65 - 99 mg/dL   BUN 12 6 - 20 mg/dL   Creatinine, Ser 0.77 0.61 - 1.24 mg/dL   Calcium 8.5 (L) 8.9 - 10.3 mg/dL   Total Protein 6.9 6.5 - 8.1 g/dL   Albumin 3.1 (L) 3.5 - 5.0 g/dL   AST 77 (H) 15 - 41 U/L   ALT 106 (H) 17 - 63 U/L   Alkaline Phosphatase 100 38 - 126 U/L  Total Bilirubin 2.3 (H) 0.3 - 1.2 mg/dL   GFR calc non Af Amer >60 >60 mL/min   GFR calc Af Amer >60 >60 mL/min    Comment: (NOTE) The eGFR has been calculated using the CKD EPI equation. This calculation has not been validated in all clinical situations. eGFR's persistently <60 mL/min signify possible Chronic Kidney Disease.    Anion gap 11 5 - 15    Comment: Performed at [facility], [address] [place], [place] [address]  Lipase, blood     Status: None   Collection Time: 06/22/17  1:01 AM  Result Value Ref Range   Lipase 26 11 - 51 U/L    Comment: Performed at [facility], [address] [place], [place] [address]  Brain natriuretic peptide - IF  patient is dyspneic     Status: None   Collection Time: 06/22/17  1:01 AM  Result Value Ref Range   B Natriuretic Peptide 39.0 0.0 - 100.0 pg/mL    Comment: Performed at [facility], [address] [place], [place] [address]  Troponin I     Status: None   Collection Time: 06/22/17  1:01 AM  Result Value Ref Range   Troponin I <0.03 <0.03 ng/mL    Comment: Performed at [facility], [address] [place], [place] [address]  CBC WITH DIFFERENTIAL     Status: Abnormal   Collection Time: 06/22/17  1:01 AM  Result Value Ref Range   WBC 7.0 3.8 - 10.6 K/uL   RBC 4.66 4.40 - 5.90 MIL/uL   Hemoglobin 14.6 13.0 - 18.0 g/dL   HCT 42.4 40.0 - 52.0 %   MCV 90.9 80.0 - 100.0 fL   MCH 31.4 26.0 - 34.0 pg   MCHC 34.6 32.0 - 36.0 g/dL   RDW 14.2 11.5 - 14.5 %   Platelets 152 150 - 440 K/uL   Neutrophils Relative % 79 %   Neutro Abs 5.5 1.4 - 6.5 K/uL   Lymphocytes Relative 13 %   Lymphs Abs 0.9 (L) 1.0 - 3.6 K/uL   Monocytes Relative 7 %   Monocytes Absolute 0.5 0.2 - 1.0 K/uL   Eosinophils Relative 1 %   Eosinophils Absolute 0.1 0 - 0.7 K/uL   Basophils Relative 0 %   Basophils Absolute 0.0 0 - 0.1 K/uL    Comment: Performed at [facility], [address] [place], [place] [address]  Procalcitonin     Status: None   Collection Time: 06/22/17  1:01 AM  Result Value Ref Range   Procalcitonin 2.01 ng/mL    Comment:        Interpretation: PCT > 2 ng/mL: Systemic infection (sepsis) is likely, unless other causes are known. (NOTE)       Sepsis PCT Algorithm           Lower Respiratory Tract                                      Infection PCT Algorithm    ----------------------------     ----------------------------         PCT < 0.25 ng/mL                PCT < 0.10 ng/mL         Strongly encourage             Strongly discourage  discontinuation of antibiotics    initiation of antibiotics    ----------------------------      -----------------------------       PCT 0.25 - 0.50 ng/mL            PCT 0.10 - 0.25 ng/mL               OR       >80% decrease in PCT            Discourage initiation of                                            antibiotics      Encourage discontinuation           of antibiotics    ----------------------------     -----------------------------         PCT >= 0.50 ng/mL              PCT 0.26 - 0.50 ng/mL               AND       <80% decrease in PCT              Encourage initiation of                                             antibiotics       Encourage continuation           of antibiotics    ----------------------------     -----------------------------        PCT >= 0.50 ng/mL                  PCT > 0.50 ng/mL               AND         increase in PCT                  Strongly encourage                                      initiation of antibiotics    Strongly encourage escalation           of antibiotics                                     -----------------------------                                           PCT <= 0.25 ng/mL                                                 OR                                        >  80% decrease in PCT                                     Discontinue / Do not initiate                                             antibiotics Performed at [facility], [address] [place], [place] [address]   Protime-INR     Status: None   Collection Time: 06/22/17  1:01 AM  Result Value Ref Range   Prothrombin Time 13.5 11.4 - 15.2 seconds   INR 1.04     Comment: Performed at [facility], [address] [place], [place] [address]  Urinalysis, Complete w Microscopic     Status: Abnormal   Collection Time: 06/22/17  1:01 AM  Result Value Ref Range   Color, Urine YELLOW (A) YELLOW   APPearance CLEAR (A) CLEAR   Specific Gravity, Urine 1.008 1.005 - 1.030   pH 6.0 5.0 - 8.0   Glucose, UA NEGATIVE NEGATIVE mg/dL   Hgb urine  dipstick MODERATE (A) NEGATIVE   Bilirubin Urine NEGATIVE NEGATIVE   Ketones, ur NEGATIVE NEGATIVE mg/dL   Protein, ur NEGATIVE NEGATIVE mg/dL   Nitrite NEGATIVE NEGATIVE   Leukocytes, UA NEGATIVE NEGATIVE   RBC / HPF 6-30 0 - 5 RBC/hpf   WBC, UA 0-5 0 - 5 WBC/hpf   Bacteria, UA NONE SEEN NONE SEEN   Squamous Epithelial / LPF NONE SEEN NONE SEEN   Mucus PRESENT     Comment: Performed at [facility], [address] [place], [place] [address]  Influenza panel by PCR (type A & B)     Status: None   Collection Time: 06/22/17  1:06 AM  Result Value Ref Range   Influenza A By PCR NEGATIVE NEGATIVE   Influenza B By PCR NEGATIVE NEGATIVE    Comment: (NOTE) The Xpert Xpress Flu assay is intended as an aid in the diagnosis of  influenza and should not be used as a sole basis for treatment.  This  assay is FDA approved for nasopharyngeal swab specimens only. Nasal  washings and aspirates are unacceptable for Xpert Xpress Flu testing. Performed at [facility], [address] [place], [place] [address]   Lactic acid, plasma     Status: None   Collection Time: 06/22/17  4:23 AM  Result Value Ref Range   Lactic Acid, Venous 1.5 0.5 - 1.9 mmol/L    Comment: Performed at [facility], [address], [place], [place] [address]   Ct Abdomen Pelvis W Contrast  Result Date: 06/22/2017 CLINICAL DATA:  Acute onset of generalized abdominal pain.  Sepsis. EXAM: CT ABDOMEN AND PELVIS WITH CONTRAST TECHNIQUE: Multidetector CT imaging of the abdomen and pelvis was performed using the standard protocol following bolus administration of intravenous contrast. CONTRAST:  141m ISOVUE-300 IOPAMIDOL (ISOVUE-300) INJECTION 61% COMPARISON:  CT of the abdomen and pelvis performed 03/08/2016, and abdominal ultrasound performed 03/09/2016 FINDINGS: Lower chest: Bibasilar atelectasis is noted. Diffuse coronary artery calcifications are seen. Mitral valve calcification is also noted.  Hepatobiliary: Small stones are seen within the gallbladder. Vague soft tissue inflammation is noted about the relatively decompressed gallbladder; acute cholecystitis is a concern. There is mild prominence of the intrahepatic biliary ducts.  The liver is otherwise unremarkable in appearance. The common bile duct remains normal in caliber. Pancreas: The pancreas is within normal limits. Spleen: The spleen is unremarkable in appearance. Adrenals/Urinary Tract: The adrenal glands are unremarkable in appearance. The kidneys are within normal limits. There is no evidence of hydronephrosis. No renal or ureteral stones are identified. No perinephric stranding is seen. Stomach/Bowel: The stomach is unremarkable in appearance. The small bowel is within normal limits. The appendix is normal in caliber, without evidence of appendicitis. The colon is unremarkable in appearance. Vascular/Lymphatic: The abdominal aorta is unremarkable in appearance. The inferior vena cava is grossly unremarkable. No retroperitoneal lymphadenopathy is seen. No pelvic sidewall lymphadenopathy is identified. Reproductive: The bladder is decompressed, with a Foley catheter in place. Apparent bilateral bladder wall thickening is nonspecific. The prostate is borderline normal in size. Other: No additional soft tissue abnormalities are seen. Musculoskeletal: No acute osseous abnormalities are identified. The visualized musculature is unremarkable in appearance. IMPRESSION: 1. Vague soft tissue inflammation about the relatively decompressed gallbladder. Acute cholecystitis is a concern. Would correlate for associated symptoms. Mild prominence of the intrahepatic biliary ducts. 2. Apparent bilateral bladder wall thickening is nonspecific. Cystoscopy could be considered for further evaluation, if deemed clinically appropriate. 3. Diffuse coronary artery calcifications. Mitral valve calcification. 4. Bibasilar atelectasis. Electronically Signed   By:  Garald Balding M.D.   On: 06/22/2017 04:14   Dg Chest Port 1 View  Result Date: 06/22/2017 CLINICAL DATA:  84 year old male with sepsis. EXAM: PORTABLE CHEST 1 VIEW COMPARISON:  Chest radiograph dated 11/08/2016 FINDINGS: Shallow inspiration. No focal consolidation, pleural effusion, or pneumothorax. The cardiac silhouette is within normal limits. No acute osseous pathology. IMPRESSION: No active disease. Electronically Signed   By: Anner Crete M.D.   On: 06/22/2017 02:06    Review of Systems  Unable to perform ROS: Dementia    Blood pressure (!) 90/50, pulse 70, temperature 98.2 F (36.8 C), temperature source Oral, resp. rate 18, height _0  (1.88 m), weight 86.2 kg (190 lb), SpO2 98 %. Physical Exam  Vitals reviewed. Constitutional: He appears well-developed and well-nourished. No distress.  HENT:  Head: Normocephalic and atraumatic.  Mouth/Throat: Oropharynx is clear and moist.  Eyes: Conjunctivae and EOM are normal. Pupils are equal, round, and reactive to light.  Neck: Normal range of motion. Neck supple. No JVD present. No tracheal deviation present. No thyromegaly present.  Cardiovascular: Normal rate, regular rhythm and normal heart sounds. Exam reveals no gallop and no friction rub.  No murmur heard. Respiratory: Effort normal and breath sounds normal. No respiratory distress.  GI: Soft. Bowel sounds are normal. He exhibits no distension. There is no tenderness.  Genitourinary:  Genitourinary Comments: Deferred  Musculoskeletal: Normal range of motion. He exhibits no edema.  Lymphadenopathy:    He has no cervical adenopathy.  Neurological: No cranial nerve deficit.  Patient is noncommunicative  Skin: Skin is warm and dry. No rash noted. No erythema.  Vaughn:  Cannot assess mental status due to dementia     Assessment/Plan This is an 84 year old male admitted for sepsis. 1.  Sepsis: The patient meets criteria via fever, tachycardia and intermittent  tachypnea.  Procalcitonin is elevated as well as lactic acid.  Initially the patient's blood pressure was stable but now it is on the lower side.  Continue to hydrate aggressively.  Follow blood cultures for growth and sensitivities.  Initially, source of infection is unknown although now it appears the patient may have cholecystitis.  Continue Vanco  and Zosyn. 2.  Cholecystitis: Evaluated by surgery.  The patient is not a surgical candidate at this time.  Continue antimicrobial therapy 3.  Dementia: Continue Namenda 4.  DVT prophylaxis: Lovenox 5.  GI prophylaxis: None The patient is a full code.  Time spent on admission orders and patient care approximately 45 minutes  Harrie Foreman, MD 06/22/2017, 5:30 AM

## 2017-06-22 NOTE — Progress Notes (Signed)
Pharmacy Antibiotic Note  Joshua FellersJoe P Boggess is a 84 y.o. male admitted on 06/22/2017 with sepsis.  Pharmacy has been consulted for vancomycin and Zosyn dosing.  Plan: Zosyn 3.375g IV q8h (4 hour infusion).   DW 86kg  Vd 60L kei 0.074 hr-1  T1/2 9 hours Vancomycin 1250 mg q 12 hours ordered with stacked dosing. Level before 5th dose. Goal trough 15-20.  Height: 6\' 2"  (188 cm) Weight: 190 lb (86.2 kg) IBW/kg (Calculated) : 82.2  Temp (24hrs), Avg:101.2 F (38.4 C), Min:101.2 F (38.4 C), Max:101.2 F (38.4 C)  Recent Labs  Lab 06/22/17 0101  WBC 7.0  CREATININE 0.77  LATICACIDVEN 3.6*    Estimated Creatinine Clearance: 84.2 mL/min (by C-G formula based on SCr of 0.77 mg/dL).    No Known Allergies  Antimicrobials this admission: Vancomycin, Zosyn 3/11  >>    >>   Dose adjustments this admission:   Microbiology results: 3/11 BCx: pending 3/11 UCx: pending       3/11 UA: (-) 3/11 CXR: no active disease Thank you for allowing pharmacy to be a part of this patient's care.  Zeba Luby S 06/22/2017 3:03 AM

## 2017-06-22 NOTE — Consult Note (Signed)
Patient ID: Joshua Vaughn, male   DOB: Jan 01, 1934, 84 y.o.   MRN: [id number]  CC: Fevers  HPI Joshua Vaughn is a 84 y.o. male who presented to the emergency department overnight for fevers and chills.  General surgery consultation was requested by the ER physician, Dr. Karma Greaser, and the admitting hospitalist, Dr. Marcille Blanco for cholecystitis. Patient has underlying dementia and is unable to participate in his history.  History is obtained from the chart and from his wife who is at the bedside.  Per report he had been started on antibiotic recently and his family thought he was having a reaction to the medication which prompted him to call 911.  He was found to have a temperature of 101 and oxygen saturation of 82% on room air and was brought to the emergency department.  On presentation to the emergency department he met criteria for code sepsis and was started on appropriate therapy.  Patient's wife reports that he is never complained of any pain but he is nonverbal at baseline.  He wakes to light touch and loud voice.  He appears to grimace upon palpation of the right upper quadrant.  Outside of his fevers and chills patient reportedly has been spitting up more and been on medication for nausea.  No documented diarrhea or constipation.  HPI  Past Medical History:  Diagnosis Date  . Dementia   . GERD (gastroesophageal reflux disease)     Past Surgical History:  Procedure Laterality Date  . ESOPHAGOGASTRODUODENOSCOPY Left 11/10/2016   Procedure: ESOPHAGOGASTRODUODENOSCOPY (EGD);  Surgeon: Jonathon Bellows, MD;  Location: [facility] ENDOSCOPY;  Service: Gastroenterology;  Laterality: Left;  . ESOPHAGOGASTRODUODENOSCOPY (EGD) WITH PROPOFOL N/A 03/10/2016   Procedure: ESOPHAGOGASTRODUODENOSCOPY (EGD) WITH PROPOFOL;  Surgeon: Manya Silvas, MD;  Location: [facility] ENDOSCOPY;  Service: Endoscopy;  Laterality: N/A;    Family History  Problem Relation Age of Onset  . Dementia Mother   . Cancer Father     Social  History Social History   Tobacco Use  . Smoking status: Former Research scientist (life sciences)  . Smokeless tobacco: Never Used  Substance Use Topics  . Alcohol use: No  . Drug use: No    No Known Allergies  Current Facility-Administered Medications  Medication Dose Route Frequency Provider Last Rate Last Dose  . piperacillin-tazobactam (ZOSYN) IVPB 3.375 g  3.375 g Intravenous Q8H Hinda Kehr, MD      . vancomycin (VANCOCIN) 1,250 mg in sodium chloride 0.9 % 250 mL IVPB  1,250 mg Intravenous Q12H Hinda Kehr, MD         Review of Systems A full review of systems was unable to be obtained secondary to the patient's underlying dementia.  Physical Exam Blood pressure (!) 90/50, pulse 70, temperature 98.2 F (36.8 C), temperature source Oral, resp. rate 18, height 6' 2"  (1.88 m), weight 86.2 kg (190 lb), SpO2 98 %. CONSTITUTIONAL: Lying in bed no acute distress. EYES: Pupils are equal, round, and reactive to light, EARS, NOSE, MOUTH AND THROAT: The oropharynx is clear. The oral mucosa is pink and moist. Hearing appears to be intact to voice. LYMPH NODES:  Lymph nodes in the neck are normal. RESPIRATORY:  Lungs are coarse throughout.  CARDIOVASCULAR: Heart is regular  GI: The abdomen is soft, appears to be tender in the right upper quadrant with a grimace on palpation, and nondistended. There are no palpable masses.  GU: Rectal deferred.   MUSCULOSKELETAL: No cyanosis or edema.   SKIN: Turgor is good and there are no obvious  pathologic skin lesions or ulcers. NEUROLOGIC: Motor and sensation is unable to be assessed secondary to dementia.  PSYCH: Appears to be at baseline dementia  Data Reviewed Images and labs reviewed with significant laboratory abnormalities on presentation to include a lactic acidosis of 3.6 that has responded to fluid and is already down to 1.5.  A total bilirubin of 2.3, ALT of 106, AST of 77.  The remainder of his electrolytes are within normal limits.  His white blood cell  count is normal at 7.0.  Normal H&H of 14.6/42.4, normal platelets of 152.  CT scan of the abdomen and pelvis was reviewed which appears to show numerous gallstones including a gallstone at the cystic common duct junction.  The gallbladder itself shows evidence of inflammation consistent with acute cholecystitis. I have personally reviewed the patient's imaging, laboratory findings and medical records.    Assessment    Acute cholecystitis    Plan    84 year old male currently on hospice for dementia who is DNR/DNI admitted to the medicine service secondary to sepsis and found to have cholecystitis.  Had a long conversation with the patient's wife about the diagnosis and the treatment.  Discussed that the initial therapy for someone is sick as her husband is IV fluid resuscitation and IV antibiotics.  Discussed that a percentage of the time that therapy alone is enough to treat the cholecystitis.  Also discussed what appears to be a large gallstone within the cystic duct and discussed that should that not pass his gallbladder would continue to distend and some type of procedure would need to be performed.  Counseled that in his current state that I would not offer surgery but should he respond tremendously well to therapy he might become a candidate for surgical removal of the gallbladder.  Also discussed the possibility of doing a percutaneous cholecystostomy tube placement for drainage of the gallbladder and of the gallbladder infection.  Again, counseled that the patient will need to improve prior to this being a viable option.  All questions were answered to the wife's satisfaction.  She understands that she will be speaking with my partner, Dr. Rosana Hoes, later today to follow-up on how he is responding to therapy and whether or not any intervention is indicated.  General surgery will continue to follow along with you.     Time spent with the patient was 60 minutes, with more than 50% of the time spent  in face-to-face education, counseling and care coordination.     Joshua Pert, MD FACS General Surgeon 06/22/2017, 5:28 AM

## 2017-06-22 NOTE — ED Triage Notes (Signed)
Pt here from home for fever and shortness of breath. Pt is at home on hospice care with a DNR in place. Family poor historians per EMS. Only hx given was dementia.

## 2017-06-22 NOTE — ED Notes (Signed)
Chaplin paged for wife.

## 2017-06-22 NOTE — ED Notes (Signed)
Chaplin in with pt and wife.

## 2017-06-22 NOTE — ED Provider Notes (Addendum)
[facility] Emergency Department Provider Note  ____________________________________________   First MD Initiated Contact with Patient 06/22/17 [phone]     (approximate)  I have reviewed the triage vital signs and the nursing notes.   HISTORY  Chief Complaint Code Sepsis  Level 5 caveat:  history/ROS limited by acute/critical illness  HPI Joshua Vaughn is a 84 y.o. male with diagnosis of dementia, esophagitis, and who has been on hospice care for about 7 months at home who presents by EMS as a code sepsis.  Reportedly he is been started on antibiotics recently for unspecified reasons.  His family called 911 tonight because they thought he was having an allergic reaction to the medication, but the paramedics found that he was having rigors/chills from his fever.  He had an axillary temperature of 101 degrees and had an oxygen saturation of 82% on room air with no chronic baseline use of oxygen.  On a nonrebreather his oxygen level rose to 97%.  He is minimally verbal at baseline and currently is responding to noxious stimuli but will not open his eyes or follow commands.  He appears chronically ill and toxic upon arrival to the emergency department.  Past Medical History:  Diagnosis Date  . Dementia   . GERD (gastroesophageal reflux disease)     Patient Active Problem List   Diagnosis Date Noted  . Sepsis (HCC) 06/22/2017  . Hematemesis 11/08/2016  . Coffee ground emesis 03/08/2016    Past Surgical History:  Procedure Laterality Date  . ESOPHAGOGASTRODUODENOSCOPY Left 11/10/2016   Procedure: ESOPHAGOGASTRODUODENOSCOPY (EGD);  Surgeon: Wyline Mood, MD;  Location: [facility] ENDOSCOPY;  Service: Gastroenterology;  Laterality: Left;  . ESOPHAGOGASTRODUODENOSCOPY (EGD) WITH PROPOFOL N/A 03/10/2016   Procedure: ESOPHAGOGASTRODUODENOSCOPY (EGD) WITH PROPOFOL;  Surgeon: Scot Jun, MD;  Location: [facility] ENDOSCOPY;  Service: Endoscopy;  Laterality: N/A;    Prior to  Admission medications   Medication Sig Start Date End Date Taking? Authorizing Provider  docusate sodium (COLACE) 100 MG capsule Take 1 capsule (100 mg total) by mouth 2 (two) times daily. 03/12/16   Alford Highland, MD  feeding supplement, ENSURE ENLIVE, (ENSURE ENLIVE) LIQD Take 237 mLs by mouth 3 (three) times daily between meals. 11/12/16   Altamese Dilling, MD  memantine (NAMENDA) 10 MG tablet 10 mg 2 (two) times daily.  03/02/16   [provider]  Multiple Vitamin (MULTIVITAMIN WITH MINERALS) TABS tablet Take 1 tablet by mouth daily. 11/12/16   Altamese Dilling, MD  pantoprazole (PROTONIX) 40 MG tablet Take 1 tablet (40 mg total) by mouth 2 (two) times daily before a meal. 11/12/16   Altamese Dilling, MD    Allergies Patient has no known allergies.  Family History  Problem Relation Age of Onset  . Dementia Mother   . Cancer Father     Social History Social History   Tobacco Use  . Smoking status: Never Smoker  . Smokeless tobacco: Never Used  Substance Use Topics  . Alcohol use: No  . Drug use: No    Review of Systems Level 5 caveat:  history/ROS limited by acute/critical illness ____________________________________________   PHYSICAL EXAM:  ED Triage Vitals  Enc Vitals Group     BP 06/22/17 0123 118/72     Pulse Rate 06/22/17 0123 (!) 104     Resp 06/22/17 0123 (!) 31     Temp 06/22/17 0123 (!) 101.2 F (38.4 C)     Temp Source 06/22/17 0123 Rectal     SpO2 06/22/17 0123  90 %     Weight 06/22/17 0126 86.2 kg (190 lb)     Height 06/22/17 0126 1.88 m (6\' 2" )     Head Circumference --      Peak Flow --      Pain Score --      Pain Loc --      Pain Edu? --      Excl. in GC? --      Constitutional: Appears acutely ill on top of chronic illness.  Minimally responsive, only to painful stimuli Head: Atraumatic. Nose: No congestion/rhinnorhea. Mouth/Throat: Mucous membranes are dry Neck: No stridor.  No meningeal signs.     Cardiovascular: Tachycardia, regular rhythm. Good peripheral circulation. Grossly normal heart sounds. Respiratory: Increased respiratory rate with no retractions.  Lungs CTAB but diminished on the right side.   Gastrointestinal: Soft and nontender. No distention.  Musculoskeletal: No lower extremity tenderness nor edema. No gross deformities of extremities. Neurologic: Patient cannot participate in neurological exam.  He occasionally will move his extremities but is not following commands.  He has a GCS of 7 based on no eye opening, incomprehensible sounds, and withdrawal from pain   ____________________________________________   LABS (all labs ordered are listed, but only abnormal results are displayed)  Labs Reviewed  LACTIC ACID, PLASMA - Abnormal; Notable for the following components:      Result Value   Lactic Acid, Venous 3.6 (*)    All other components within normal limits  COMPREHENSIVE METABOLIC PANEL - Abnormal; Notable for the following components:   Glucose, Bld 119 (*)    Calcium 8.5 (*)    Albumin 3.1 (*)    AST 77 (*)    ALT 106 (*)    Total Bilirubin 2.3 (*)    All other components within normal limits  CBC WITH DIFFERENTIAL/PLATELET - Abnormal; Notable for the following components:   Lymphs Abs 0.9 (*)    All other components within normal limits  URINALYSIS, COMPLETE (UACMP) WITH MICROSCOPIC - Abnormal; Notable for the following components:   Color, Urine YELLOW (*)    APPearance CLEAR (*)    Hgb urine dipstick MODERATE (*)    All other components within normal limits  CULTURE, BLOOD (ROUTINE X 2)  CULTURE, BLOOD (ROUTINE X 2)  URINE CULTURE  LIPASE, BLOOD  BRAIN NATRIURETIC PEPTIDE  TROPONIN I  PROCALCITONIN  PROTIME-INR  INFLUENZA PANEL BY PCR (TYPE A & B)  LACTIC ACID, PLASMA   ____________________________________________  EKG  ED ECG REPORT I, Loleta Rose, the attending physician, personally viewed and interpreted this ECG.  Date:  06/22/2017 EKG Time: 1:18 AM Rate: 100 Rhythm: Sinus tachycardia QRS Axis: normal Intervals: Right bundle branch block ST/T Wave abnormalities: Non-specific ST segment / T-wave changes, but no evidence of acute ischemia. Narrative Interpretation: no evidence of acute ischemia   ____________________________________________  RADIOLOGY I, Loleta Rose, personally viewed and evaluated these images (plain radiographs) as part of my medical decision making, as well as reviewing the written report by the radiologist.  ED MD interpretation:  No evidence of PNA on CXR.  Questionable cholecystitis on CT abd/pelvis  Official radiology report(s): Ct Abdomen Pelvis W Contrast  Result Date: 06/22/2017 CLINICAL DATA:  Acute onset of generalized abdominal pain.  Sepsis. EXAM: CT ABDOMEN AND PELVIS WITH CONTRAST TECHNIQUE: Multidetector CT imaging of the abdomen and pelvis was performed using the standard protocol following bolus administration of intravenous contrast. CONTRAST:  ISOVUE-300 IOPAMIDOL (ISOVUE-300) INJECTION 61% COMPARISON:  CT of the abdomen  and pelvis performed 03/08/2016, and abdominal ultrasound performed 03/09/2016 FINDINGS: Lower chest: Bibasilar atelectasis is noted. Diffuse coronary artery calcifications are seen. Mitral valve calcification is also noted. Hepatobiliary: Small stones are seen within the gallbladder. Vague soft tissue inflammation is noted about the relatively decompressed gallbladder; acute cholecystitis is a concern. There is mild prominence of the intrahepatic biliary ducts. The liver is otherwise unremarkable in appearance. The common bile duct remains normal in caliber. Pancreas: The pancreas is within normal limits. Spleen: The spleen is unremarkable in appearance. Adrenals/Urinary Tract: The adrenal glands are unremarkable in appearance. The kidneys are within normal limits. There is no evidence of hydronephrosis. No renal or ureteral stones are identified. No  perinephric stranding is seen. Stomach/Bowel: The stomach is unremarkable in appearance. The small bowel is within normal limits. The appendix is normal in caliber, without evidence of appendicitis. The colon is unremarkable in appearance. Vascular/Lymphatic: The abdominal aorta is unremarkable in appearance. The inferior vena cava is grossly unremarkable. No retroperitoneal lymphadenopathy is seen. No pelvic sidewall lymphadenopathy is identified. Reproductive: The bladder is decompressed, with a Foley catheter in place. Apparent bilateral bladder wall thickening is nonspecific. The prostate is borderline normal in size. Other: No additional soft tissue abnormalities are seen. Musculoskeletal: No acute osseous abnormalities are identified. The visualized musculature is unremarkable in appearance. IMPRESSION: 1. Vague soft tissue inflammation about the relatively decompressed gallbladder. Acute cholecystitis is a concern. Would correlate for associated symptoms. Mild prominence of the intrahepatic biliary ducts. 2. Apparent bilateral bladder wall thickening is nonspecific. Cystoscopy could be considered for further evaluation, if deemed clinically appropriate. 3. Diffuse coronary artery calcifications. Mitral valve calcification. 4. Bibasilar atelectasis. Electronically Signed   By: Roanna Raider M.D.   On: 06/22/2017 04:14   Dg Chest Port 1 View  Result Date: 06/22/2017 CLINICAL DATA:  84 year old male with sepsis. EXAM: PORTABLE CHEST 1 VIEW COMPARISON:  Chest radiograph dated 11/08/2016 FINDINGS: Shallow inspiration. No focal consolidation, pleural effusion, or pneumothorax. The cardiac silhouette is within normal limits. No acute osseous pathology. IMPRESSION: No active disease. Electronically Signed   By: Elgie Collard M.D.   On: 06/22/2017 02:06    ____________________________________________   PROCEDURES  Critical Care performed: Yes, see critical care procedure note(s)   Procedure(s)  performed:   .Critical Care Performed by: Loleta Rose, MD Authorized by: Loleta Rose, MD   Critical care provider statement:    Critical care time (minutes):  45   Critical care time was exclusive of:  Separately billable procedures and treating other patients   Critical care was necessary to treat or prevent imminent or life-threatening deterioration of the following conditions:  Sepsis and respiratory failure   Critical care was time spent personally by me on the following activities:  Development of treatment plan with patient or surrogate, discussions with consultants, evaluation of patient's response to treatment, examination of patient, obtaining history from patient or surrogate, ordering and performing treatments and interventions, ordering and review of laboratory studies, ordering and review of radiographic studies, pulse oximetry, re-evaluation of patient's condition and review of old charts      ____________________________________________   INITIAL IMPRESSION / ASSESSMENT AND PLAN / ED COURSE  As part of my medical decision making, I reviewed the following data within the electronic MEDICAL RECORD NUMBER History obtained from family, Nursing notes reviewed and incorporated, Labs reviewed , EKG interpreted , Radiograph reviewed , Discussed with admitting physician (Dr. Sheryle Hail) and A consult was requested and obtained from this/these consultant(s) Surgery (Dr. Tonita Cong)  Differential diagnosis includes, but is not limited to, sepsis, pneumonia, urinary tract infection, ACS, CVA, less likely cellulitis or meningitis or bacteremia.  The patient most likely is suffering from pneumonia based on his very low room air oxygen saturation.  He is clearly septic and I have made him a code sepsis; workup is pending.  He has no evidence of shock or hypotension at this point but that may develop.  He comes with DNR paperwork in place which I think is very appropriate based on his baseline  disability with chronic dementia.  I reviewed his chart and his primary care provider, Dr. Bethann PunchesMark Miller, noted back in August 2018 (about 7 months ago), that his prognosis was poor and his life expectancy was anticipated to be less than 6 months.  No family is here currently but I will honor the DNR/DNI and provide aggressive treatment for sepsis but not advance care with anything invasive including no intubation nor chest compressions should he decline rapidly.  I will attempt to get in touch with family if they do not arrive shortly in the emergency department.  Clinical Course as of Jun 22 437  Mon Jun 22, 2017  0155 Lactic Acid, Venous: (!!) 3.6 [CF]  0213 Total Bilirubin: (!) 2.3 [CF]  0214 No evidence of UTI.  CXR clear with no evidence of infiltrate.  [CF]  0256 Influenza A By PCR: NEGATIVE [CF]  0301 CT scan pending.  Discussed case by phone with Dr. Sheryle Hailiamond with the hospitalist service who will admit.  I discussed evaluation and prognosis with the daughter at bedside.  She is tearful but understanding, and she affirmed the DNR/DNI which I agree with based on his chronic conditions and acute critical illness.  [CF]  0422 CT scan concerning for cholecystitis, which would explain the source of infection in the absence of any other explanation.  I called and spoke with Dr. Tonita CongWoodham by phone and explained the situation.  He recommended I speak with the wife and see if they would want to proceed with surgery even if it is an option.  The wife ask if she could wait a few hours and call her kids to discuss the appropriate plan.  I updated Dr. Lucretia RoersWood him with this question and he said he will come in and see the patient now in the hospital.  We will go ahead and move him to his assigned bed and I informed Dr. Tonita CongWoodham about where he is going so that he can see him as soon as possible.  I updated Dr. Sheryle Haildiamond with the hospitalist service about the plan as well. CT ABDOMEN PELVIS W CONTRAST [CF]    Clinical  Course User Index [CF] Loleta RoseForbach, Ethelbert Thain, MD    ____________________________________________  FINAL CLINICAL IMPRESSION(S) / ED DIAGNOSES  Final diagnoses:  Sepsis, due to unspecified organism [facility](HCC)  Acute respiratory failure with hypoxia (HCC)  Elevated lactic acid level  Elevated bilirubin  Acute cholecystitis  Elevated LFTs     MEDICATIONS GIVEN DURING THIS VISIT:  Medications  vancomycin (VANCOCIN) 1,250 mg in sodium chloride 0.9 % 250 mL IVPB (not administered)  piperacillin-tazobactam (ZOSYN) IVPB 3.375 g (not administered)  sodium chloride 0.9 % bolus 1,000 mL (0 mLs Intravenous Stopped 06/22/17 0300)  piperacillin-tazobactam (ZOSYN) IVPB 3.375 g (0 g Intravenous Stopped 06/22/17 0300)  vancomycin (VANCOCIN) IVPB 1000 mg/200 mL premix (0 mg Intravenous Stopped 06/22/17 0301)  acetaminophen (TYLENOL) suppository 650 mg ( Rectal Canceled Entry 06/22/17 0409)  iopamidol (ISOVUE-300) 61 % injection 100  mL (100 mLs Intravenous Contrast Given 06/22/17 0353)     ED Discharge Orders    None       Note:  This document was prepared using Dragon voice recognition software and may include unintentional dictation errors.    Loleta Rose, MD 06/22/17 1610    Loleta Rose, MD 06/22/17 [phone]

## 2017-06-22 NOTE — Progress Notes (Signed)
Chaplain was paged to support wife of a Pt .Marland Kitchen. Chaplain prayed and engaged wife in memories of the Pt better days. Wife is having a hard time letting go. She is not practing self care. Chap. Gave her a prayer shawl to comfort her.    06/22/17 0400  Clinical Encounter Type  Visited With Patient and family together  Visit Type Initial;Spiritual support  Referral From Nurse  Spiritual Encounters  Spiritual Needs Prayer;Emotional

## 2017-06-22 NOTE — ED Notes (Signed)
Pt to the er for fever. Pt wife is POA and she states that hospice nurse saw him on Friday and had antibiotics and cough medicine called in. On Saturday he received a dose of meds in applesauce. Wife states later he vomited so she thinks he is allergic. Pt wife called the hospice nurse and another antibiotic in suppository form and nausea medication given. Last dose of tylenol given at home was at 1300. Wife reports dry hacking cough. Wife reports pt began shaking and daughter got scared and called EMS.

## 2017-06-22 NOTE — Progress Notes (Signed)
CODE SEPSIS - PHARMACY COMMUNICATION  **Broad Spectrum Antibiotics should be administered within 1 hour of Sepsis diagnosis**  Time Code Sepsis Called/Page Received: 03/11 0055  Antibiotics Ordered: Vanc/Zosyn 3/11 0055  Time of 1st antibiotic administration: 03/11 [id number]  Additional action taken by pharmacy:   If necessary, Name of Provider/Nurse Contacted:     Erich MontaneMcBane,Joshua Vaughn ,PharmD Clinical Pharmacist  06/22/2017  1:57 AM

## 2017-06-22 NOTE — Progress Notes (Signed)
   06/22/17 1030  Clinical Encounter Type  Visited With Patient and family together  Visit Type Initial;Spiritual support  Referral From Nurse  Consult/Referral To Chaplain  Spiritual Encounters  Spiritual Needs Prayer;Emotional   [facility] to check in on PT and his family. PT was alert and being feed Ice Cream. PT has dementia but did responded to a few questions when prompted by his wife. CH encouraged family and prayed with them. CH will follow up as needed.

## 2017-06-23 ENCOUNTER — Encounter: Payer: Self-pay | Admitting: Internal Medicine

## 2017-06-23 DIAGNOSIS — R509 Fever, unspecified: Secondary | ICD-10-CM

## 2017-06-23 LAB — URINE CULTURE: Culture: NO GROWTH

## 2017-06-23 LAB — COMPREHENSIVE METABOLIC PANEL
ALT: 85 U/L — ABNORMAL HIGH (ref 17–63)
ANION GAP: 6 (ref 5–15)
AST: 70 U/L — AB (ref 15–41)
Albumin: 2.4 g/dL — ABNORMAL LOW (ref 3.5–5.0)
Alkaline Phosphatase: 82 U/L (ref 38–126)
BUN: 11 mg/dL (ref 6–20)
CHLORIDE: 106 mmol/L (ref 101–111)
CO2: 23 mmol/L (ref 22–32)
Calcium: 7.9 mg/dL — ABNORMAL LOW (ref 8.9–10.3)
Creatinine, Ser: 0.74 mg/dL (ref 0.61–1.24)
GFR calc Af Amer: >60 mL/min (ref >60)
Glucose, Bld: 86 mg/dL (ref 65–99)
POTASSIUM: 3 mmol/L — AB (ref 3.5–5.1)
Sodium: 135 mmol/L (ref 135–145)
Total Bilirubin: 1.8 mg/dL — ABNORMAL HIGH (ref 0.3–1.2)
Total Protein: 5.3 g/dL — ABNORMAL LOW (ref 6.5–8.1)

## 2017-06-23 LAB — CBC
HCT: 36.7 % — ABNORMAL LOW (ref 40.0–52.0)
Hemoglobin: 12.3 g/dL — ABNORMAL LOW (ref 13.0–18.0)
MCH: 31 pg (ref 26.0–34.0)
MCHC: 33.4 g/dL (ref 32.0–36.0)
MCV: 92.8 fL (ref 80.0–100.0)
PLATELETS: 139 10*3/uL — AB (ref 150–440)
RBC: 3.96 MIL/uL — ABNORMAL LOW (ref 4.40–5.90)
RDW: 14.3 % (ref 11.5–14.5)
WBC: 7.5 10*3/uL (ref 3.8–10.6)

## 2017-06-23 MED ORDER — POLYETHYLENE GLYCOL 3350 17 G PO PACK
17.0000 g | PACK | Freq: Two times a day (BID) | ORAL | Status: DC
  Administered 2017-06-23 – 2017-06-24 (×2): 17 g via ORAL
  Filled 2017-06-23 (×2): qty 1

## 2017-06-23 MED ORDER — DOCUSATE SODIUM 50 MG/5ML PO LIQD
100.0000 mg | Freq: Two times a day (BID) | ORAL | Status: DC
  Administered 2017-06-23 – 2017-06-24 (×2): 100 mg via ORAL
  Filled 2017-06-23 (×4): qty 10

## 2017-06-23 NOTE — Progress Notes (Signed)
[facility] Physicians - [place] at [facility]   PATIENT NAME: Joshua Vaughn    MR#:  [id number]  DATE OF BIRTH:  1933/11/10  SUBJECTIVE:   More sleepy today. No pain or vomiting. Daughter at bedside  REVIEW OF SYSTEMS:  Review of system unobtainable as the patient is demented  DRUG ALLERGIES:  No Known Allergies  VITALS:  Blood pressure (!) 110/50, pulse 86, temperature 98.6 F (37 C), temperature source Oral, resp. rate 20, height 6\' 2"  (1.88 m), weight 71.1 kg (156 lb 11.2 oz), SpO2 96 %.  PHYSICAL EXAMINATION:  GENERAL:  84 y.o.-year-old patient lying in the bed with no acute distress.  EYES: Pupils equal, round, reactive to light and accommodation. No scleral icterus. Extraocular muscles intact.  HEENT: Head atraumatic, normocephalic. Oropharynx and nasopharynx clear.  NECK:  Supple, no jugular venous distention. No thyroid enlargement, no tenderness.  LUNGS: Normal breath sounds bilaterally, no wheezing, rales,rhonchi or crepitation. No use of accessory muscles of respiration.  CARDIOVASCULAR: S1, S2 normal. No murmurs, rubs, or gallops.  ABDOMEN: Soft, nontender, nondistended. Bowel sounds present.  EXTREMITIES: No pedal edema, cyanosis, or clubbing.  NEUROLOGIC: Cranial nerves II through XII are intact. Sensation intact. Gait not checked.  PSYCHIATRIC: The patient is drowzy SKIN: No obvious rash, lesion, or ulcer.    LABORATORY PANEL:   CBC Recent Labs  Lab 06/23/17 0531  WBC 7.5  HGB 12.3*  HCT 36.7*  PLT 139*   ------------------------------------------------------------------------------------------------------------------  Chemistries  Recent Labs  Lab 06/23/17 0531  NA 135  K 3.0*  CL 106  CO2 23  GLUCOSE 86  BUN 11  CREATININE 0.74  CALCIUM 7.9*  AST 70*  ALT 85*  ALKPHOS 82  BILITOT 1.8*   ------------------------------------------------------------------------------------------------------------------  Cardiac  Enzymes Recent Labs  Lab 06/22/17 0101  TROPONINI <0.03   ------------------------------------------------------------------------------------------------------------------  RADIOLOGY:  Ct Abdomen Pelvis W Contrast  Result Date: 06/22/2017 CLINICAL DATA:  Acute onset of generalized abdominal pain.  Sepsis. EXAM: CT ABDOMEN AND PELVIS WITH CONTRAST TECHNIQUE: Multidetector CT imaging of the abdomen and pelvis was performed using the standard protocol following bolus administration of intravenous contrast. CONTRAST:  ISOVUE-300 IOPAMIDOL (ISOVUE-300) INJECTION 61% COMPARISON:  CT of the abdomen and pelvis performed 03/08/2016, and abdominal ultrasound performed 03/09/2016 FINDINGS: Lower chest: Bibasilar atelectasis is noted. Diffuse coronary artery calcifications are seen. Mitral valve calcification is also noted. Hepatobiliary: Small stones are seen within the gallbladder. Vague soft tissue inflammation is noted about the relatively decompressed gallbladder; acute cholecystitis is a concern. There is mild prominence of the intrahepatic biliary ducts. The liver is otherwise unremarkable in appearance. The common bile duct remains normal in caliber. Pancreas: The pancreas is within normal limits. Spleen: The spleen is unremarkable in appearance. Adrenals/Urinary Tract: The adrenal glands are unremarkable in appearance. The kidneys are within normal limits. There is no evidence of hydronephrosis. No renal or ureteral stones are identified. No perinephric stranding is seen. Stomach/Bowel: The stomach is unremarkable in appearance. The small bowel is within normal limits. The appendix is normal in caliber, without evidence of appendicitis. The colon is unremarkable in appearance. Vascular/Lymphatic: The abdominal aorta is unremarkable in appearance. The inferior vena cava is grossly unremarkable. No retroperitoneal lymphadenopathy is seen. No pelvic sidewall lymphadenopathy is identified. Reproductive:  The bladder is decompressed, with a Foley catheter in place. Apparent bilateral bladder wall thickening is nonspecific. The prostate is borderline normal in size. Other: No additional soft tissue abnormalities are seen. Musculoskeletal: No acute osseous abnormalities are  identified. The visualized musculature is unremarkable in appearance. IMPRESSION: 1. Vague soft tissue inflammation about the relatively decompressed gallbladder. Acute cholecystitis is a concern. Would correlate for associated symptoms. Mild prominence of the intrahepatic biliary ducts. 2. Apparent bilateral bladder wall thickening is nonspecific. Cystoscopy could be considered for further evaluation, if deemed clinically appropriate. 3. Diffuse coronary artery calcifications. Mitral valve calcification. 4. Bibasilar atelectasis. Electronically Signed   By: Roanna RaiderJeffery  Chang M.D.   On: 06/22/2017 04:14   Dg Chest Port 1 View  Result Date: 06/22/2017 CLINICAL DATA:  84 year old male with sepsis. EXAM: PORTABLE CHEST 1 VIEW COMPARISON:  Chest radiograph dated 11/08/2016 FINDINGS: Shallow inspiration. No focal consolidation, pleural effusion, or pneumothorax. The cardiac silhouette is within normal limits. No acute osseous pathology. IMPRESSION: No active disease. Electronically Signed   By: Elgie CollardArash  Radparvar M.D.   On: 06/22/2017 02:06    EKG:   Orders placed or performed during the hospital encounter of 06/22/17  . EKG 12-Lead  . EKG 12-Lead  . EKG 12-Lead  . EKG 12-Lead  . EKG 12-Lead  . EKG 12-Lead    ASSESSMENT AND PLAN:    This is an 84 year old male admitted for sepsis.  *Sepsis.  Etiology unclear.  Most likely source seems to be cholecystitis On IV Zosyn.  Cultures no growth to date.  Discussed with Dr. Earlene Plateravis of surgery.  Repeat bilirubin in the morning. N.p.o. after midnight. Decision regarding surgery depending on the bilirubin trend. No contraindications for surgery.  *  Dementia: Continue Namenda  *  DVT  prophylaxis: Lovenox  All the records are reviewed and case discussed with Care Management/Social Workerr. Management plans discussed with the patient, family and they are in agreement.  CODE STATUS: DNR  TOTAL TIME TAKING CARE OF THIS PATIENT: 35 minutes.   POSSIBLE D/C IN 1-2 DAYS, DEPENDING ON CLINICAL CONDITION.  Note: This dictation was prepared with Dragon dictation along with smaller phrase technology. Any transcriptional errors that result from this process are unintentional.   Orie FishermanSrikar R Shamiah Kahler M.D on 06/23/2017 at 3:08 PM  Between 7am to 6pm - Pager - [phone] After 6pm go to [email or web address] - password [email or web address] [facility]  [facility]  Office  [phone]  CC: Primary care physician; Danella PentonMiller, Mark F, MD

## 2017-06-23 NOTE — Progress Notes (Signed)
SURGICAL PROGRESS NOTE (cpt [phone])  Hospital Day(s): 1.   Post op day(s):  Joshua Vaughn   Interval History: Patient seen and examined, no acute events overnight. Patient remains with advanced dementia and minimally verbal at baseline. Patient's daughter this morning accordingly reports no new complaints or indications to suggest pain, N/V, fever/chills, CP, or SOB, and patient tolerated regular diet yesterday.  Review of Systems: Limited to HPI due to non-verbal status with advanced dementia  Vital signs in last 24 hours: [min-max] current  Temp:  [97.6 F (36.4 C)-98.5 F (36.9 C)] 98.4 F (36.9 C) (03/12 0404) Pulse Rate:  [63-78] 73 (03/12 0404) Resp:  [20-23] 23 (03/12 0404) BP: (95-117)/(50-57) 108/50 (03/12 0404) SpO2:  [95 %-100 %] 95 % (03/12 0404)     Height: 6\' 2"  (188 cm) Weight: 156 lb 11.2 oz (71.1 kg) BMI (Calculated): 20.11   Intake/Output this shift:  Total I/O In: 2575 [I.V.:2425; IV Piggyback:150] Out: 750 [Urine:750]   Intake/Output last 2 shifts:  @IOLAST2SHIFTS @   Physical Exam:  Constitutional: resting comfortably in no apparent distress  HENT: normocephalic without obvious abnormality  Eyes: PERRL, EOM's grossly intact and symmetric  Neuro: CN II - XII grossly intact and symmetric without deficit  Respiratory: breathing non-labored at rest  Cardiovascular: regular rate and sinus rhythm  Gastrointestinal: soft, completely non-tender, and non-distended Musculoskeletal: no edema or wounds and NT  Labs:  CBC Latest Ref Rng & Units 06/23/2017 06/22/2017 11/12/2016  WBC 3.8 - 10.6 K/uL 7.5 7.0 6.7  Hemoglobin 13.0 - 18.0 g/dL 12.3(L) 14.6 14.4  Hematocrit 40.0 - 52.0 % 36.7(L) 42.4 41.5  Platelets 150 - 440 K/uL 139(L) 152 202   CMP Latest Ref Rng & Units 06/22/2017 06/22/2017 11/12/2016  Glucose 65 - 99 mg/dL 604(V) 409(W) 119(J)  BUN 6 - 20 mg/dL 10 12 8   Creatinine 0.61 - 1.24 mg/dL 4.78 2.95 6.21  Sodium 135 - 145 mmol/L 137 137 138  Potassium 3.5 - 5.1 mmol/L  3.1(L) 3.6 3.4(L)  Chloride 101 - 111 mmol/L 108 104 107  CO2 22 - 32 mmol/L 23 22 25   Calcium 8.9 - 10.3 mg/dL 7.9(L) 8.5(L) 9.1  Total Protein 6.5 - 8.1 g/dL 3.0(Q) 6.9 -  Total Bilirubin 0.3 - 1.2 mg/dL 6.5(H) 2.3(H) -  Alkaline Phos 38 - 126 U/L 78 100 -  AST 15 - 41 U/L 72(H) 77(H) -  ALT 17 - 63 U/L 88(H) 106(H) -   Total Bilirubin: 2.3 (06/22/2017) <-- 1.8 (11/07/2016) <-- 2.4 (03/10/2016) <-- 5.0 (03/09/2016) <-- 5.1 (03/08/2016)  Imaging studies: No new pertinent imaging studies   Assessment/Plan: (ICD-10's: R50.9, E80.6) 84 y.o. male with resolved fever to 101.2 and shaking the night of presentation, none since without acetaminophen or other antipyretic, transient hypoxia attributed to hyperventilation, acute +/- chronic hyperbilirubinemia (may have passed a gallstone +/- cholangitis?), cholelithiasis and "vague soft tissue inflammation about relatively decompressed gallbladder" without acute RUQ abdominal pain/tenderness, and normal WBC, having tolerated regular diet on the day of admission, complicated by pertinent comorbidities including advanced dementia, advanced chronological age, and GERD.              - monitor abdominal exam and bowel function             - IV fluids, antibiotics, and medical management per primary medical team   - if passed gallstone(s) with fever attributable to cholangitis, may benefit from cholecystectomy             - risk stratification and optimization  if surgery to be considered             - repeat/trend total bilirubin again tomorrow morning             - DVT prophylaxis  All of the above findings and recommendations were discussed at length with patient's daughter and primary medical physician, and all of patient's family's questions were answered to their expressed satisfaction.  Thank you for the opportunity to participate in this patient's care.  -- Scherrie GerlachJason E. Earlene Plateravis, MD, RPVI : [facility] General Surgery -  Partnering for exceptional care. Office: [phone]

## 2017-06-23 NOTE — Progress Notes (Signed)
SLP Cancellation Note  Patient Details Name: Joshua Vaughn MRN: [id number] DOB: 11/15/1933   Cancelled treatment:       Reason Eval/Treat Not Completed: Fatigue/lethargy limiting ability to participate(chart reviewed. ) Read MD notes indicating NPO after midnight for surgery tomorrow. Pt has tolerated his Pureed diet since yesterday per report; pt is more sleepy today. Pt has baseline advance Dementia. ST services will f/u w/ pt's status next 1-2 days post his surgery. Recommend continue a Puree consistency diet w/ aspiration precautions post surgery; monitor toleration of thin liquids. Oral care.    Jerilynn SomKatherine Darcus Edds, MS, CCC-SLP Anarosa Kubisiak 06/23/2017, 4:39 PM

## 2017-06-24 LAB — COMPREHENSIVE METABOLIC PANEL
ALBUMIN: 2.5 g/dL — AB (ref 3.5–5.0)
ALT: 62 U/L (ref 17–63)
ANION GAP: 9 (ref 5–15)
AST: 42 U/L — ABNORMAL HIGH (ref 15–41)
Alkaline Phosphatase: 73 U/L (ref 38–126)
BILIRUBIN TOTAL: 1.9 mg/dL — AB (ref 0.3–1.2)
BUN: 9 mg/dL (ref 6–20)
CO2: 23 mmol/L (ref 22–32)
Calcium: 8.2 mg/dL — ABNORMAL LOW (ref 8.9–10.3)
Chloride: 106 mmol/L (ref 101–111)
Creatinine, Ser: 0.69 mg/dL (ref 0.61–1.24)
Glucose, Bld: 100 mg/dL — ABNORMAL HIGH (ref 65–99)
POTASSIUM: 2.8 mmol/L — AB (ref 3.5–5.1)
Sodium: 138 mmol/L (ref 135–145)
TOTAL PROTEIN: 5.8 g/dL — AB (ref 6.5–8.1)

## 2017-06-24 LAB — MAGNESIUM: Magnesium: 1.9 mg/dL (ref 1.7–2.4)

## 2017-06-24 LAB — BILIRUBIN, DIRECT: BILIRUBIN DIRECT: 0.6 mg/dL — AB (ref 0.1–0.5)

## 2017-06-24 MED ORDER — POTASSIUM CHLORIDE 10 MEQ/100ML IV SOLN
10.0000 meq | INTRAVENOUS | Status: AC
  Administered 2017-06-24 (×2): 10 meq via INTRAVENOUS
  Filled 2017-06-24 (×2): qty 100

## 2017-06-24 MED ORDER — POTASSIUM CHLORIDE 10 MEQ/100ML IV SOLN
10.0000 meq | INTRAVENOUS | Status: DC
  Administered 2017-06-24 (×3): 10 meq via INTRAVENOUS
  Filled 2017-06-24 (×3): qty 100

## 2017-06-24 MED ORDER — POTASSIUM CHLORIDE 20 MEQ PO PACK
40.0000 meq | PACK | Freq: Once | ORAL | Status: DC
Start: 2017-06-24 — End: 2017-06-24

## 2017-06-24 MED ORDER — POTASSIUM CHLORIDE ER 10 MEQ PO TBCR: 20.0000 meq | EXTENDED_RELEASE_TABLET | Freq: Every day | ORAL | Status: AC

## 2017-06-24 MED ORDER — BISACODYL 10 MG RE SUPP
10.0000 mg | Freq: Once | RECTAL | Status: AC
  Administered 2017-06-24: 10 mg via RECTAL
  Filled 2017-06-24: qty 1

## 2017-06-24 MED ORDER — AMOXICILLIN-POT CLAVULANATE 250-62.5 MG/5ML PO SUSR: 500.0000 mg | Freq: Two times a day (BID) | ORAL | 0 refills | Status: AC

## 2017-06-24 MED ORDER — MINERAL OIL RE ENEM: 1.0000 | ENEMA | Freq: Once | RECTAL | Status: DC

## 2017-06-24 MED ORDER — POTASSIUM CHLORIDE 10 MEQ/100ML IV SOLN: 10.0000 meq | INTRAVENOUS | Status: DC

## 2017-06-24 NOTE — Progress Notes (Signed)
Pharmacy Antibiotic Note  Joshua FellersJoe P Route is a 84 y.o. male admitted on 06/22/2017 with sepsis.  Pharmacy has been consulted for Zosyn dosing.  Plan: Zosyn 3.375g IV q8h (4 hour infusion).    Height: 6\' 2"  (188 cm) Weight: 161 lb 8 oz (73.3 kg) IBW/kg (Calculated) : 82.2  Temp (24hrs), Avg:98.9 F (37.2 C), Min:98.2 F (36.8 C), Max:99.5 F (37.5 C)  Recent Labs  Lab 06/22/17 0101 06/22/17 0423 06/22/17 1922 06/23/17 0531 06/24/17 0411  WBC 7.0  --   --  7.5  --   CREATININE 0.77  --  0.64 0.74 0.69  LATICACIDVEN 3.6* 1.5  --   --   --     Estimated Creatinine Clearance: 75.1 mL/min (by C-G formula based on SCr of 0.69 mg/dL).    No Known Allergies  Antimicrobials this admission: Zosyn 3/11  >>   Dose adjustments this admission:  Microbiology results: 3/11 BCx: NGTD 3/11 UCx: NG Final 3/11 UA: (-) 3/11 CXR: no active disease Thank you for allowing pharmacy to be a part of this patient's care.  Carola FrostNathan A Pooja Camuso, Pharm.D., BCPS Clinical Pharmacist 06/24/2017 8:43 AM

## 2017-06-24 NOTE — Clinical Social Work Note (Signed)
Clinical Social Work Assessment  Patient Details  Name: Joshua Vaughn MRN: [id number] Date of Birth: 01/17/1934  Date of referral:  06/24/17               Reason for consult:  Facility Placement                Permission sought to share information with:  Facility Medical sales representativeContact Representative, Family Supports Permission granted to share information::  Yes, Verbal Permission Granted  Name::     Drue FlirtColeman,Susie T Spouse [phone]  or   Agency::  SNF admissions  Relationship::     Contact Information:     Housing/Transportation Living arrangements for the past 2 months:  Single Family Home Source of Information:  Spouse Patient Interpreter Needed:  None Criminal Activity/Legal Involvement Pertinent to Current Situation/Hospitalization:  No - Comment as needed Significant Relationships:  Adult Children, Spouse Lives with:  Spouse Do you feel safe going back to the place where you live?  No Need for family participation in patient care:  Yes (Comment)  Care giving concerns:  Patient's family need to use respite days so patient will be [facility] respite.     Social Worker assessment / plan:  Patient's family would like patient to go to [facility] for respite, since patient is being [facility] by [facility] services.  CSW spoke to patient's wife, and she is expecting him to only go for a few days.  CSW spoke to [facility] and they can accept patient today.  Patient's family did not have any other questions or concerns.  Employment status:  Retired Database administratornsurance information:  Managed Medicare PT Recommendations:  Not assessed at this time Information / Referral to community resources:  Skilled Nursing Facility  Patient/Family's Response to care:  Patient's family in in agreement to going to [facility] Patient/Family's Understanding of and Emotional Response to Diagnosis, Current Treatment, and Prognosis:  Patient's family is aware of  prognosis.  Emotional Assessment Appearance:  Appears stated age Attitude/Demeanor/Rapport:    Affect (typically observed):  Calm, Appropriate Orientation:  Oriented to Self Alcohol / Substance use:  Not Applicable Psych involvement (Current and /or in the community):  No (Comment)  Discharge Needs  Concerns to be addressed:  Care Coordination Readmission within the last 30 days:  No Current discharge risk:  None Barriers to Discharge:  No Barriers Identified   Darleene Cleavernterhaus, Ulyses Panico R, LCSWA 06/24/2017, 3:40 PM

## 2017-06-24 NOTE — Discharge Instructions (Signed)
Recommended Diet at Discharge:  Dysphagia level 1 (PUREE) w/ NECTAR consistency liquids; Aspiration Precautions. Feeding support and monitoring at meals. May use a straw and pinch to control amount - stop using if increased coughing. Reduce distractions.

## 2017-06-24 NOTE — Progress Notes (Signed)
  Speech Language Pathology Treatment: Dysphagia  Patient Details Name: Joshua Vaughn MRN: [id number] DOB: 05/21/1933 Today's Date: 06/24/2017 Time: 0900-0950 SLP Time Calculation (min) (ACUTE ONLY): 50 min  Assessment / Plan / Recommendation Clinical Impression  Pt seen for ongoing toleration of diet; po's in general. Surgery consult/monitoring for procedure yesterday for Acute cholecystitis; pt has returned to his Dysphagia level 1(puree) diet today. NSG noted min increased coughing this morning w/ liquid medication.  Pt required min more positioning upright in bed for oral intake. Pt consumed thin liquid trials w/ delayed throat clearing and cough intermittently. Pt was then given trials of NECTAR consistency liquids w/ reduced overt s/s of aspiration noted - 1 mild throat clearing x1 post trials. Pt has advanced Dementia which may be impacting his timing of the pharyngeal swallow. Suspect he would benefit from changing to thickened liquids in his diet to reduce risk for aspiration and reduce risk for Pulmonary decline, aspiration pneumonia included. Wife was given education on bolus delivery and option of pinched straw, or cup use, vs spoon feeding; have pt help to hold the cup for increased awareness of task; aspiration precautions; food and liquid consistency and options; food preparation and use of condiments on foods to moisten and increase flavor(interest).  Recommend a Dysphagia level 1(PUREE) diet w/ NECTAR consistency liquids. Strict aspiration precautions; feeding support at all meals. Dietician f/u for nutritional support - use of drink supplements(nectar consistency). ST services will be available for further education w/ family as needed on swallowing, dysphagia, and impact of Dementia on swallowing.     HPI HPI: Pt with past medical history of Alzheimer's Dementia and GERD presents to the emergency department via EMS after being found at home with rigors and fever.  The patient's  primary care doctor prescribed antibiotics for suspected infection.  His wife thought that he may be having an allergic reaction but further evaluation revealed that the patient was hypoxic to 82% on room air and that he was not moving a lot of air during respiration.  He was placed on nonrebreather mask which improved his oxygen saturations.  In the emergency department CT of his abdomen revealed cholecystitis which prompted the emergency department staff to call the hospitalist service for admission. Pt resides at home w/ family; requires assistance for ADLs. NSG reported min increased coughing this morning. Pt was NPO for procedure yesterday.      SLP Plan  Continue with current plan of care       Recommendations  Diet recommendations: Dysphagia 1 (puree);Nectar-thick liquid Liquids provided via: Cup;Straw;Teaspoon((pinced straw)) Medication Administration: Crushed with puree(if necessary/able to crush; or in liquid form mixed in puree) Supervision: Staff to assist with self feeding;Full supervision/cueing for compensatory strategies Compensations: Minimize environmental distractions;Slow rate;Small sips/bites;Lingual sweep for clearance of pocketing;Multiple dry swallows after each bite/sip;Follow solids with liquid Postural Changes and/or Swallow Maneuvers: Seated upright 90 degrees;Upright 30-60 min after meal                General recommendations: (Dietician f/u for nutritional supplements) Oral Care Recommendations: Oral care BID;Staff/trained caregiver to provide oral care Follow up Recommendations: Skilled Nursing facility SLP Visit Diagnosis: Dysphagia, oropharyngeal phase (R13.12) Plan: Continue with current plan of care       GO                Jerilynn SomKatherine Arine Foley, MS, CCC-SLP Shonice Wrisley 06/24/2017, 11:18 AM

## 2017-06-24 NOTE — Plan of Care (Signed)
Pt d/ced to [facility].  He is much more alert today than yesterday.  Took meds well and ate more.  Replaced K+ IV.  Pt had BM after getting dulcolax suppository.  Called report to [phone].  Pt going to Rm 503.  [facility] nurse.  Transporting via EMS.  Wife at bedside.

## 2017-06-24 NOTE — Care Management (Signed)
Spoke with wife at the bedside. States she would like for her husband to have Respite care per [facility]. Spoke with Alvino ChapelJo, states that Mr. Effie ShyColeman can go to [facility]. Fl2 would need to go to [facility] for review. Update to Minerva AreolaEric, Clinical Social Worker Gwenette GreetBrenda S Dnaiel Voller RN MSN CCM Care Management [phone]

## 2017-06-24 NOTE — Clinical Social Work Placement (Signed)
   CLINICAL SOCIAL WORK PLACEMENT  NOTE  Date:  06/24/2017  Patient Details  Name: Joshua Vaughn MRN: [id number] Date of Birth: 08/07/1933  Clinical Social Work is seeking post-discharge placement for this patient at the Skilled  Nursing Facility level of care (*CSW will initial, date and re-position this form in  chart as items are completed):  Yes   Patient/family provided with [place] Clinical Social Work Department's list of facilities offering this level of care within the geographic area requested by the patient (or if unable, by the patient's family).  Yes   Patient/family informed of their freedom to choose among providers that offer the needed level of care, that participate in Medicare, Medicaid or managed care program needed by the patient, have an available bed and are willing to accept the patient.  Yes   Patient/family informed of [facility]'s ownership interest in [facility] and [facility], as well as of the fact that they are under no obligation to receive care at these facilities.  PASRR submitted to EDS on 06/24/17     PASRR number received on       Existing PASRR number confirmed on 06/24/17     FL2 transmitted to all facilities in geographic area requested by pt/family on 06/24/17     FL2 transmitted to all facilities within larger geographic area on       Patient informed that his/her managed care company has contracts with or will negotiate with certain facilities, including the following:        Yes   Patient/family informed of bed offers received.  Patient [facility] bed at [facility] [place]     Physician recommends and patient chooses bed at      Patient to be [facility] [place] on 06/24/17.  Patient to be transferred to facility [facility]     Patient family notified on 06/24/17 of transfer.  Name of family member notified:  [name] was at bedside.     PHYSICIAN Please sign FL2      Additional Comment:    _______________________________________________ Darleene CleaverAnterhaus, Jathniel Smeltzer R, LCSWA 06/24/2017, 3:49 PM

## 2017-06-24 NOTE — Progress Notes (Signed)
SURGICAL PROGRESS NOTE (cpt [phone])  Hospital Day(s): 2.   Post op day(s):  Marland Kitchen.   Interval History: Patient seen and examined, no acute events overnight. Patient remains with advanced dementia and minimally verbal at baseline. Patient's daughter this morning accordingly reports no new complaints or indications to suggest pain, N/V, fever/chills, CP, or SOB, and patient tolerated regular diet yesterday.  Review of Systems: Limited to HPI due to non-verbal status with advanced dementia   Vital signs in last 24 hours: [min-max] current  Temp:  [98.2 F (36.8 C)-99.5 F (37.5 C)] 98.2 F (36.8 C) (03/13 0356) Pulse Rate:  [68-86] 68 (03/13 0356) Resp:  [20-23] 22 (03/13 0356) BP: (110-125)/(50-57) 125/56 (03/13 0356) SpO2:  [92 %-98 %] 92 % (03/13 0356) Weight:  [161 lb 8 oz (73.3 kg)] 161 lb 8 oz (73.3 kg) (03/13 0356)     Height: 6\' 2"  (188 cm) Weight: 161 lb 8 oz (73.3 kg) BMI (Calculated): 20.73   Intake/Output this shift:  Total I/O In: 100 [IV Piggyback:100] Out: 2250 [Urine:2250]   Intake/Output last 2 shifts:  @IOLAST2SHIFTS @   Physical Exam:  Constitutional: resting comfortably in no apparent distress  HENT: normocephalic without obvious abnormality  Eyes: PERRL, EOM's grossly intact and symmetric  Neuro: CN II - XII grossly intact and symmetric without deficit  Respiratory: breathing non-labored at rest  Cardiovascular: regular rate and sinus rhythm  Gastrointestinal: soft, completely non-tender, and non-distended Musculoskeletal: no edema or wounds and NT  Labs:  CBC Latest Ref Rng & Units 06/23/2017 06/22/2017 11/12/2016  WBC 3.8 - 10.6 K/uL 7.5 7.0 6.7  Hemoglobin 13.0 - 18.0 g/dL 12.3(L) 14.6 14.4  Hematocrit 40.0 - 52.0 % 36.7(L) 42.4 41.5  Platelets 150 - 440 K/uL 139(L) 152 202   CMP Latest Ref Rng & Units 06/24/2017 06/23/2017 06/22/2017  Glucose 65 - 99 mg/dL 811(B100(H) 86 147(W113(H)  BUN 6 - 20 mg/dL 9 11 10   Creatinine 0.61 - 1.24 mg/dL 2.950.69 6.210.74 3.080.64  Sodium 135  - 145 mmol/L 138 135 137  Potassium 3.5 - 5.1 mmol/L 2.8(L) 3.0(L) 3.1(L)  Chloride 101 - 111 mmol/L 106 106 108  CO2 22 - 32 mmol/L 23 23 23   Calcium 8.9 - 10.3 mg/dL 8.2(L) 7.9(L) 7.9(L)  Total Protein 6.5 - 8.1 g/dL 6.5(H5.8(L) 5.3(L) 5.7(L)  Total Bilirubin 0.3 - 1.2 mg/dL 8.4(O1.9(H) 9.6(E1.8(H) 9.5(M1.5(H)  Alkaline Phos 38 - 126 U/L 73 82 78  AST 15 - 41 U/L 42(H) 70(H) 72(H)  ALT 17 - 63 U/L 62 85(H) 88(H)   Direct Bilirubin (06/24/2017): 0.6  Imaging studies: No new pertinent imaging studies   Assessment/Plan:(ICD-10's: R50.9,E80.6) 83 y.o.malewith resolved fever and shaking the night of presentation, none since without acetaminophen or other antipyretic, transient hypoxia attributed to hyperventilation, essentially unchanged chronic hyperbilirubinemia not appearing indicative of patient having passed a gallstone despite cholelithiasis and "vague soft tissue inflammation about relatively decompressed gallbladder" without RUQ abdominal pain/tendernessand normal WBC, having tolerated regular diet on the day of admission and since,complicated by comorbidities includingadvanced dementia, advanced chronological age, and GERD.  - no apparent indication for surgical intervention at this time - IV fluids, antibiotics, medical management, and disposition per primary medical team             - will signoff, please call if any questions or concerns - DVT prophylaxis  All of the above findings and recommendations were discussedwith patient's daughter and primary medical physician, and all of patient's family's questions were answered to their expressed satisfaction.  Thank you for the opportunity to participate in this patient's care.  -- Scherrie Gerlach Earlene Plater, MD, RPVI [place]: [facility] General Surgery - Partnering for exceptional care. Office: [phone]

## 2017-06-24 NOTE — Clinical Social Work Note (Signed)
CSW was informed that patient's [facility] is [facility] since [facility].  CS[facility] spoke to [facility] and they can accept patient today.  [facility] to [facility].  Patient and family agreeable to plans will transport via ems RN to call report [phone], [phone].  Ervin KnackEric R. Vash Quezada, MSW, Theresia MajorsLCSWA [phone]  06/24/2017 3:12 PM

## 2017-06-24 NOTE — Discharge Summary (Signed)
[facility] [place] [facility]   PATIENT NAME: Joshua Vaughn    MR#:  [id number]  DATE OF BIRTH:  05/05/1933  DATE OF ADMISSION:  06/22/2017 ADMITTING PHYSICIAN: Arnaldo NatalMichael S Diamond, MD  DATE OF DISCHARGE: 06/24/2017  PRIMARY CARE PHYSICIAN: Danella PentonMiller, Mark F, MD   ADMISSION DIAGNOSIS:  Acute cholecystitis [K81.0] Elevated LFTs [R94.5] Acute respiratory failure with hypoxia (HCC) [J96.01] Elevated bilirubin [R17] Elevated lactic acid level [R79.89] Sepsis, due to unspecified organism (HCC) [A41.9]  DISCHARGE DIAGNOSIS:  Active Problems:   Sepsis (HCC)   Acute cholecystitis   SECONDARY DIAGNOSIS:   Past Medical History:  Diagnosis Date  . Dementia   . GERD (gastroesophageal reflux disease)      ADMITTING HISTORY  Chief Complaint: Fever HPI: The patient with past medical history of dementia presents to the emergency department via EMS after being found at home with rigors and fever.  The patient's primary care doctor prescribed antibiotics for suspected infection.  His wife thought that he may be having an allergic reaction but further evaluation revealed that the patient was hypoxic to 82% on room air and that he was not moving a lot of air during respiration.  He was placed on nonrebreather mask which improved his oxygen saturations.  In the emergency department CT of his abdomen revealed cholecystitis which prompted the emergency department staff to call the hospitalist service for admission.   HOSPITAL COURSE:   This is an 84 year old male admitted for sepsis  *Sepsis.  Etiology unclear.   Initial consideration was likely source to be cholecystitis due to elevated bilirubin.  Patient does have chronically elevated bilirubin that has not changed significantly during the hospital stay.  He has no abdominal pain or vomiting.  CT scan did not show acute cholecystitis just chronic changes.  Surgical consult was obtained.  No plans for surgery.  Patient is  tolerating diet.  No fever.  Was on Zosyn in the hospital and will be changed to oral Augmentin at discharge.  * Dementia: Continue Namenda  *Acute hypoxic respiratory failure likely due to sepsis and atelectasis.  Resolved quickly.  Chest x-ray was clear.   Recommended Diet at Discharge:  Dysphagia level 1 (PUREE) w/ NECTAR consistency liquids; Aspiration Precautions. Feeding support and monitoring at meals. May use a straw and pinch to control amount - stop using if increased coughing. Reduce distractions.  Patient is being discharged [facility]  CONSULTS OBTAINED:  Treatment Team:  Ancil Linseyavis, Jason Evan, MD  DRUG ALLERGIES:  No Known Allergies  DISCHARGE MEDICATIONS:   Allergies as of 06/24/2017   No Known Allergies     Medication List    STOP taking these medications   amoxicillin 250 MG/5ML suspension Commonly known as:  AMOXIL   cephALEXin 500 MG capsule Commonly known as:  KEFLEX   multivitamin with minerals Tabs tablet     TAKE these medications   amoxicillin-clavulanate 250-62.5 MG/5ML suspension Commonly known as:  AUGMENTIN Take 10 mLs (500 mg total) by mouth 2 (two) times daily for 10 days.   docusate sodium 100 MG capsule Commonly known as:  COLACE Take 1 capsule (100 mg total) by mouth 2 (two) times daily.   feeding supplement (ENSURE ENLIVE) Liqd Take 237 mLs by mouth 3 (three) times daily between meals.   guaiFENesin 600 MG 12 hr tablet Commonly known as:  MUCINEX Take 600 mg by mouth 2 (two) times daily.   pantoprazole 40 MG tablet Commonly known as:  PROTONIX Take 40 mg by mouth daily. What changed:  Another medication with the same name was removed. Continue taking this medication, and follow the directions you see here.   potassium chloride 10 MEQ tablet Commonly known as:  K-DUR Take 2 tablets (20 mEq total) by mouth daily.   sennosides-docusate sodium 8.6-50 MG tablet Commonly known as:  SENOKOT-S Take 1-2  tablets by mouth daily as needed for constipation.       Today   VITAL SIGNS:  Blood pressure (!) 125/56, pulse 68, temperature 98.2 F (36.8 C), temperature source Oral, resp. rate (!) 22, height 6\' 2"  (1.88 m), weight 73.3 kg (161 lb 8 oz), SpO2 92 %.  I/O:    Intake/Output Summary (Last 24 hours) at 06/24/2017 1134 Last data filed at 06/24/2017 0900 Gross per 24 hour  Intake 340 ml  Output 3200 ml  Net -2860 ml    PHYSICAL EXAMINATION:  Physical Exam  GENERAL:  84 y.o.-year-old patient lying in the bed with no acute distress.  LUNGS: Normal breath sounds bilaterally, no wheezing, rales,rhonchi or crepitation. No use of accessory muscles of respiration.  CARDIOVASCULAR: S1, S2 normal. No murmurs, rubs, or gallops.  ABDOMEN: Soft, non-tender, non-distended. Bowel sounds present. No organomegaly or mass.  PSYCHIATRIC: The patient is alert and awake. confused  DATA REVIEW:   CBC Recent Labs  Lab 06/23/17 0531  WBC 7.5  HGB 12.3*  HCT 36.7*  PLT 139*    Chemistries  Recent Labs  Lab 06/24/17 0411  NA 138  K 2.8*  CL 106  CO2 23  GLUCOSE 100*  BUN 9  CREATININE 0.69  CALCIUM 8.2*  MG 1.9  AST 42*  ALT 62  ALKPHOS 73  BILITOT 1.9*    Cardiac Enzymes Recent Labs  Lab 06/22/17 0101  TROPONINI <0.03    Microbiology Results  Results for orders placed or performed during the hospital encounter of 06/22/17  Blood Culture (routine x 2)     Status: None (Preliminary result)   Collection Time: 06/22/17  1:00 AM  Result Value Ref Range Status   Specimen Description BLOOD LEFT ANTECUBITAL  Final   Special Requests   Final    BOTTLES DRAWN AEROBIC AND ANAEROBIC Blood Culture adequate volume   Culture   Final    NO GROWTH 2 DAYS Performed at [facility], [address], [place], [place] [address]    Report Status PENDING  Incomplete  Urine culture     Status: None   Collection Time: 06/22/17  1:06 AM  Result Value Ref Range Status    Specimen Description   Final    URINE, RANDOM Performed at [facility], [address] [place], [place] [address]    Special Requests   Final    NONE Performed at [facility], [address], [place], [place] [address]    Culture   Final    NO GROWTH Performed at [facility], [address], [place], [place] [address]    Report Status 06/23/2017 FINAL  Final  Blood Culture (routine x 2)     Status: None (Preliminary result)   Collection Time: 06/22/17  1:15 AM  Result Value Ref Range Status   Specimen Description BLOOD RIGHT FOREARM  Final   Special Requests   Final    BOTTLES DRAWN AEROBIC AND ANAEROBIC Blood Culture adequate volume   Culture   Final    NO GROWTH 2 DAYS Performed at [facility], [address], [place], [place] [address]    Report Status PENDING  Incomplete    RADIOLOGY:  No results found.  Follow up with PCP in 1 week.  Management plans discussed with the patient, family and they are in agreement.  CODE STATUS:     Code Status Orders  (From admission, onward)        Start     Ordered   06/22/17 0533  Do not attempt resuscitation (DNR)  Continuous    Question Answer Comment  In the event of cardiac or respiratory ARREST Do not call a "code blue"   In the event of cardiac or respiratory ARREST Do not perform Intubation, CPR, defibrillation or ACLS   In the event of cardiac or respiratory ARREST Use medication by any route, position, wound care, and other measures to relive pain and suffering. May use oxygen, suction and manual treatment of airway obstruction as needed for comfort.      06/22/17 0532    Code Status History    Date Active Date Inactive Code Status Order ID Comments User Context   06/22/2017 00:59 06/22/2017 05:32 DNR [id number]  Loleta Rose, MD ED   11/08/2016 03:23 11/12/2016 18:35 DNR [id number]  Arnaldo Natal, MD Inpatient   03/08/2016 19:27 03/12/2016 15:56 DNR [id number]  Ramonita Lab,  MD Inpatient   03/08/2016 18:32 03/08/2016 19:27 Full Code [id number]  Ramonita Lab, MD ED    Advance Directive Documentation     Most Recent Value  Type of Advance Directive  Living will  Pre-existing out of facility DNR order (yellow form or pink MOST form)  Physician notified to receive inpatient order, Yellow form placed in chart (order not valid for inpatient use)  "MOST" Form in Place?  No data      TOTAL TIME TAKING CARE OF THIS PATIENT ON DAY OF DISCHARGE: more than 30 minutes.   Orie Fisherman M.D on 06/24/2017 at 11:34 AM  Between 7am to 6pm - Pager - [phone]  After 6pm go to [email or web address] - password [email or web address]  [facility]  Office  [phone]  CC: Primary care physician; Danella Penton, MD  Note: This dictation was prepared with Dragon dictation along with smaller phrase technology. Any transcriptional errors that result from this process are unintentional.

## 2017-06-24 NOTE — NC FL2 (Signed)
[place] MEDICAID FL2 LEVEL OF CARE SCREENING TOOL     IDENTIFICATION  Patient Name: Joshua Vaughn Birthdate: 09-22-1933 Sex: male Admission Date (Current Location): 06/22/2017  [place] and [place] Number:  Chiropodist and Address:  [facility], [address], [place], [place] [address]      Provider Number: [id number]  Attending Physician Name and Address:  Milagros Loll, MD  Relative Name and Phone Number:  Jamol, Ginyard [phone] or Loy,Robin Daughter   [phone] or Hall,Denise Daughter   [phone] or Marwin, Primmer   [phone]     Current Level of Care: Hospital Recommended Level of Care: Skilled Nursing Facility Prior Approval Number:    Date Approved/Denied:   PASRR Number: [id number]  Discharge Plan: SNF    Current Diagnoses: Patient Active Problem List   Diagnosis Date Noted  . Sepsis (HCC) 06/22/2017  . Acute cholecystitis   . Hematemesis 11/08/2016  . Coffee ground emesis 03/08/2016    Orientation RESPIRATION BLADDER Height & Weight     Self  Normal Incontinent Weight: 161 lb 8 oz (73.3 kg) Height:  6\' 2"  (188 cm)  BEHAVIORAL SYMPTOMS/MOOD NEUROLOGICAL BOWEL NUTRITION STATUS      Incontinent Diet(Dysphagia level 1 (PUREE) w/ NECTAR consistency liquids)  AMBULATORY STATUS COMMUNICATION OF NEEDS Skin   Total Care Verbally Normal                       Personal Care Assistance Level of Assistance  Bathing, Feeding, Dressing, Total care Bathing Assistance: Maximum assistance Feeding assistance: Maximum assistance Dressing Assistance: Maximum assistance Total Care Assistance: Maximum assistance   Functional Limitations Info  Sight, Hearing, Speech Sight Info: Adequate Hearing Info: Adequate Speech Info: Adequate    SPECIAL CARE FACTORS FREQUENCY                       Contractures      Additional Factors Info  Code Status, Allergies Code Status Info: DNR Allergies Info:  NKA           Current Medications (06/24/2017):  This is the current hospital active medication list Current Facility-Administered Medications  Medication Dose Route Frequency Provider Last Rate Last Dose  . acetaminophen (TYLENOL) tablet 650 mg  650 mg Oral Q6H PRN Arnaldo Natal, MD   650 mg at 06/23/17 2156   Or  . acetaminophen (TYLENOL) suppository 650 mg  650 mg Rectal Q6H PRN Arnaldo Natal, MD      . docusate (COLACE) 50 MG/5ML liquid 100 mg  100 mg Oral BID Milagros Loll, MD   100 mg at 06/24/17 0835  . enoxaparin (LOVENOX) injection 40 mg  40 mg Subcutaneous Q24H Arnaldo Natal, MD   40 mg at 06/23/17 2157  . memantine (NAMENDA) tablet 10 mg  10 mg Oral BID Arnaldo Natal, MD   10 mg at 06/24/17 4742  . multivitamin with minerals tablet 1 tablet  1 tablet Oral Daily Ramonita Lab, MD   1 tablet at 06/24/17 0836  . ondansetron (ZOFRAN) tablet 4 mg  4 mg Oral Q6H PRN Arnaldo Natal, MD       Or  . ondansetron [facility]) injection 4 mg  4 mg Intravenous Q6H PRN Arnaldo Natal, MD      . pantoprazole (PROTONIX) EC tablet 40 mg  40 mg Oral BID Ramonita Lab, MD   40 mg at 06/24/17 0836  . piperacillin-tazobactam (ZOSYN) IVPB 3.375  g  3.375 g Intravenous Tedra CoupeQ8H Loleta RoseForbach, Cory, MD   Stopped at 06/24/17 1227  . polyethylene glycol (MIRALAX / GLYCOLAX) packet 17 g  17 g Oral BID Milagros LollSudini, Srikar, MD   17 g at 06/24/17 0836  . potassium chloride 10 mEq in 100 mL IVPB  10 mEq Intravenous Q1 Hr x 2 Sudini, Srikar, MD 100 mL/hr at 06/24/17 1249 10 mEq at 06/24/17 1249     Discharge Medications: Please see discharge summary for a list of discharge medications.  Relevant Imaging Results:  Relevant Lab Results:   Additional Information SSN [id number]  Darleene Cleavernterhaus, Leane Loring R, [place]

## 2017-06-27 DIAGNOSIS — R509 Fever, unspecified: Secondary | ICD-10-CM

## 2017-06-27 LAB — CULTURE, BLOOD (ROUTINE X 2)
Culture: NO GROWTH
Culture: NO GROWTH
SPECIAL REQUESTS: ADEQUATE
Special Requests: ADEQUATE

## 2019-07-23 IMAGING — CT CT ABD-PELV W/ CM
2 of 5 series · 15 of 46 positions shown, 17 images · IV contrast (iopamidol)
Comparison: CT of the abdomen and pelvis performed 03/08/2016, and
abdominal ultrasound performed 03/09/2016

CLINICAL DATA: Acute onset of generalized abdominal pain.  Sepsis.

EXAM:
CT ABDOMEN AND PELVIS WITH CONTRAST
TECHNIQUE: Multidetector CT imaging of the abdomen and pelvis was performed
using the standard protocol following bolus administration of
intravenous contrast.
CONTRAST:  100mL 5FHX6D-5SS IOPAMIDOL (5FHX6D-5SS) INJECTION 61%

[Series 2: routine abd/pel with · axial · 0.86mm/px · z∈[-1110,-640]mm · 12 of 106 slices shown, 14 images]
[im 6/106  soft-tissue]
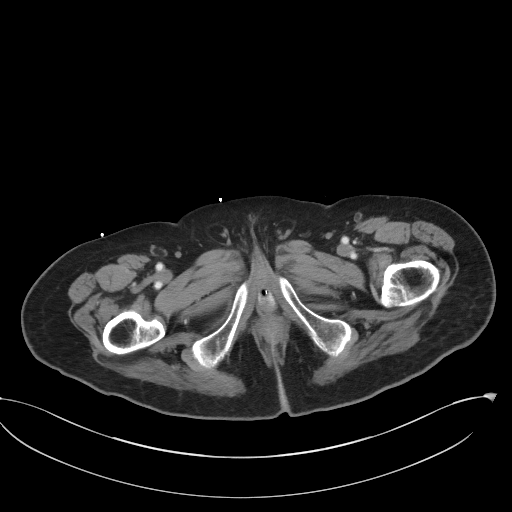
[im 6/106  bone]
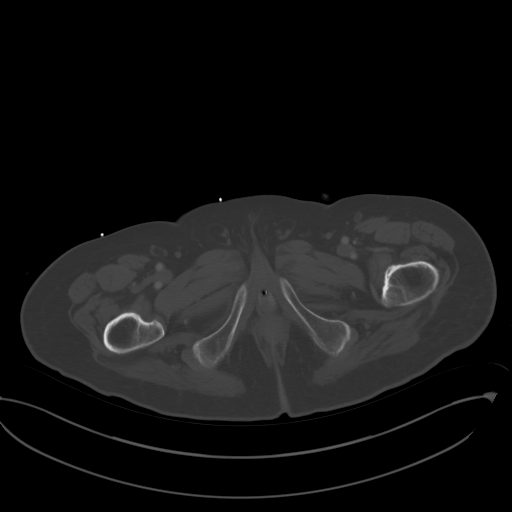
[im 17/106  soft-tissue]
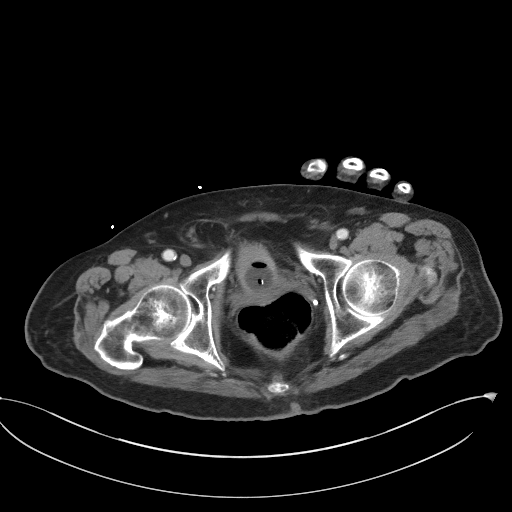
[im 23/106  soft-tissue]
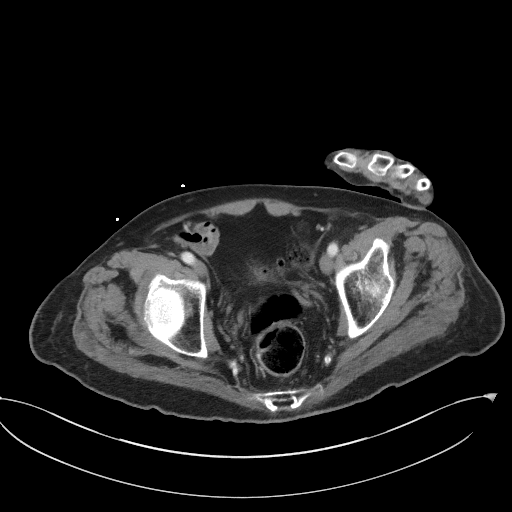
[im 34/106  soft-tissue]
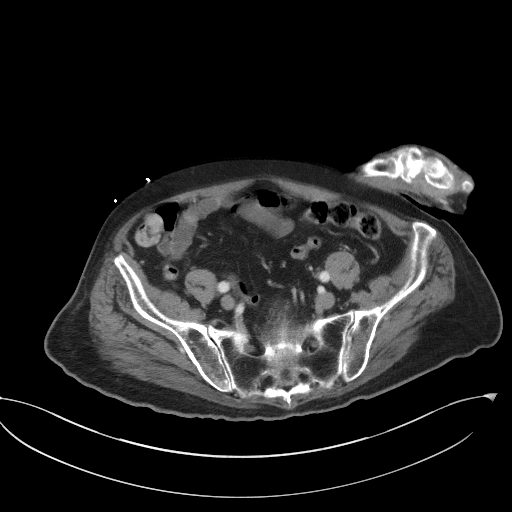
[im 39/106  soft-tissue]
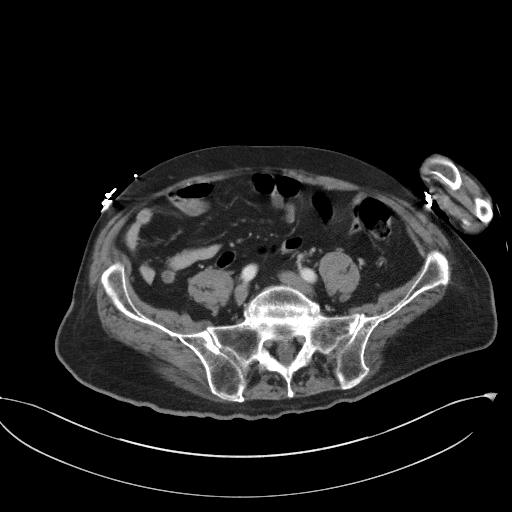
[im 50/106  soft-tissue]
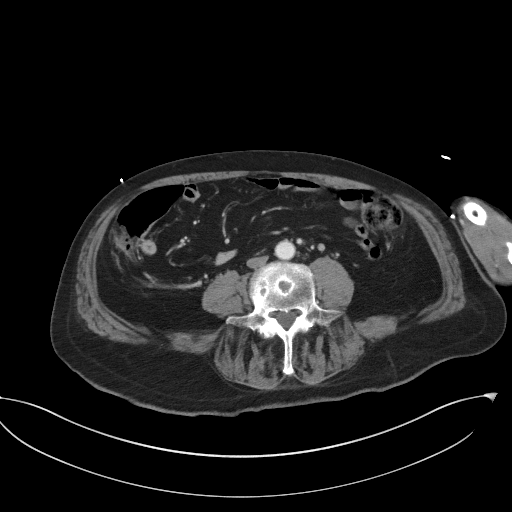
[im 56/106  soft-tissue]
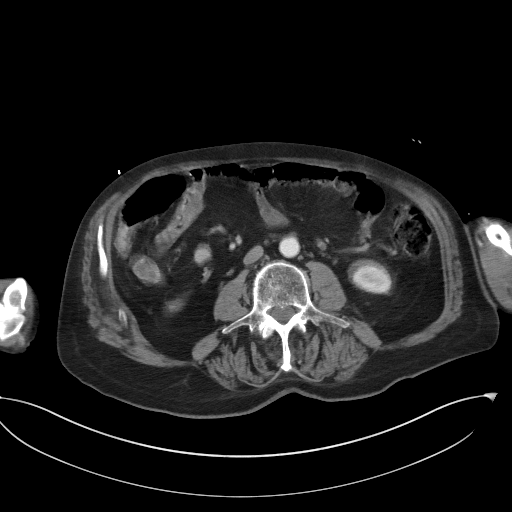
[im 67/106  soft-tissue]
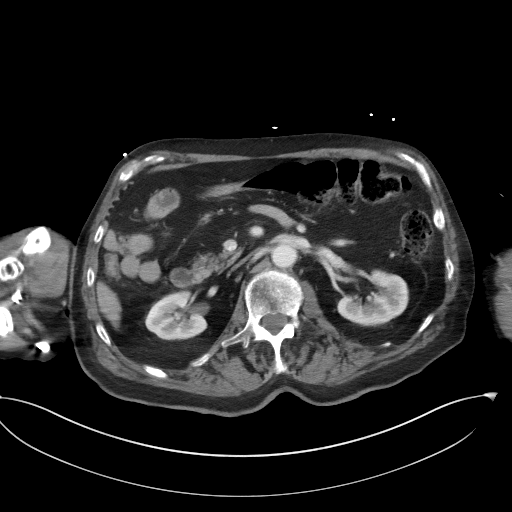
[im 72/106  soft-tissue]
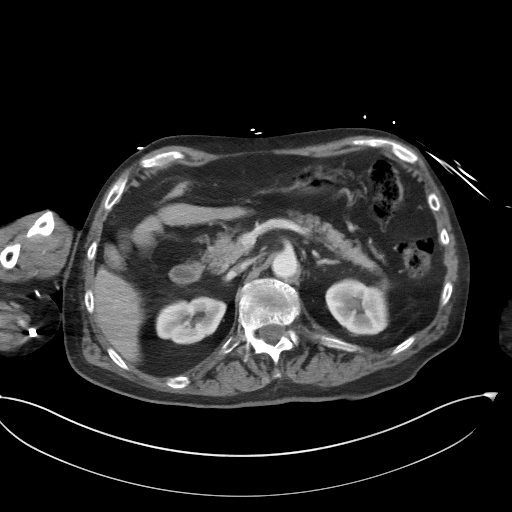
[im 72/106  bone]
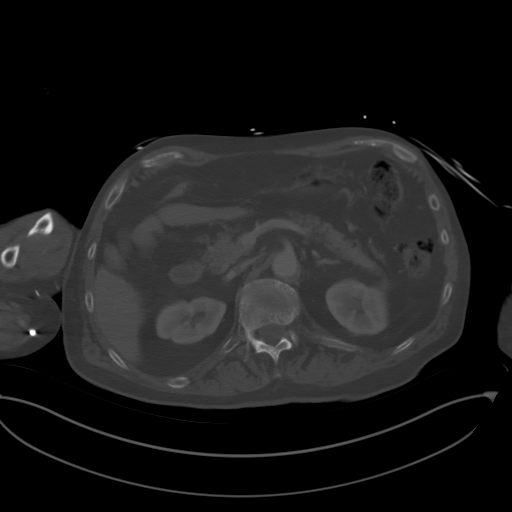
[im 83/106  soft-tissue]
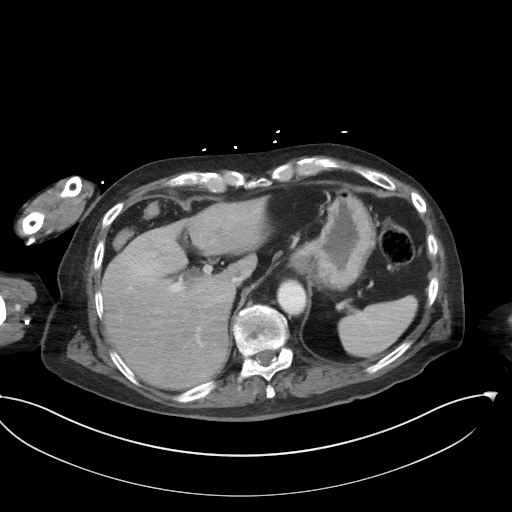
[im 89/106  soft-tissue]
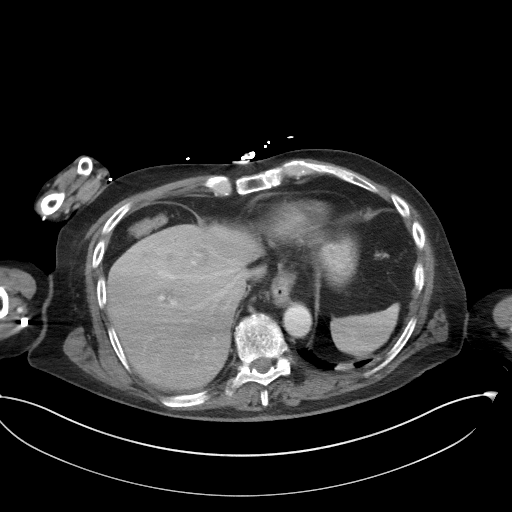
[im 100/106  soft-tissue]
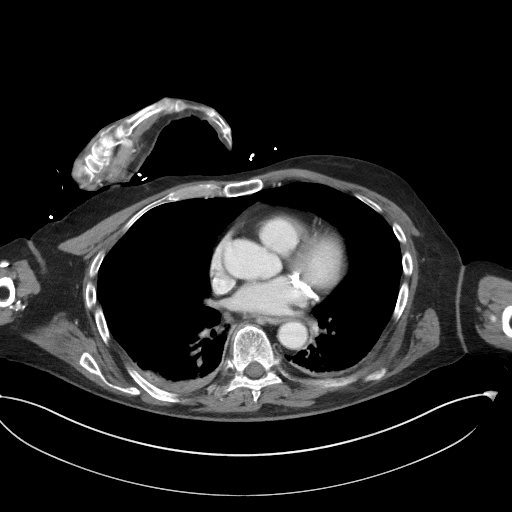

[Series 5: coronal st · coronal · 0.75mm/px · 3 of 86 slices shown]
[im 29/86  soft-tissue]
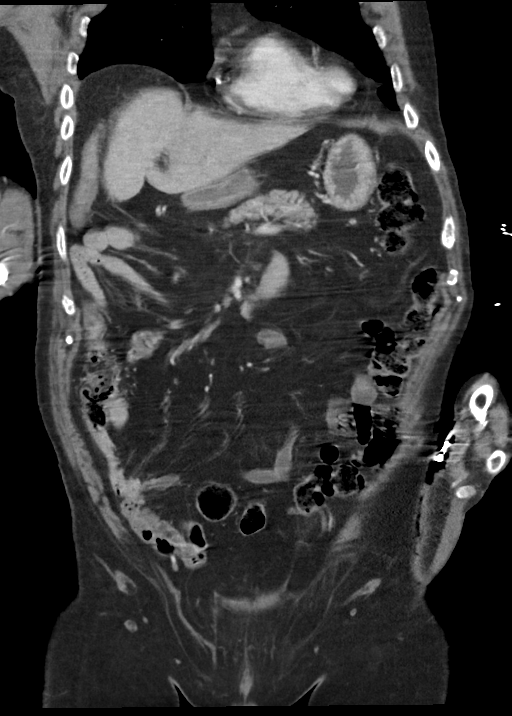
[im 38/86  soft-tissue]
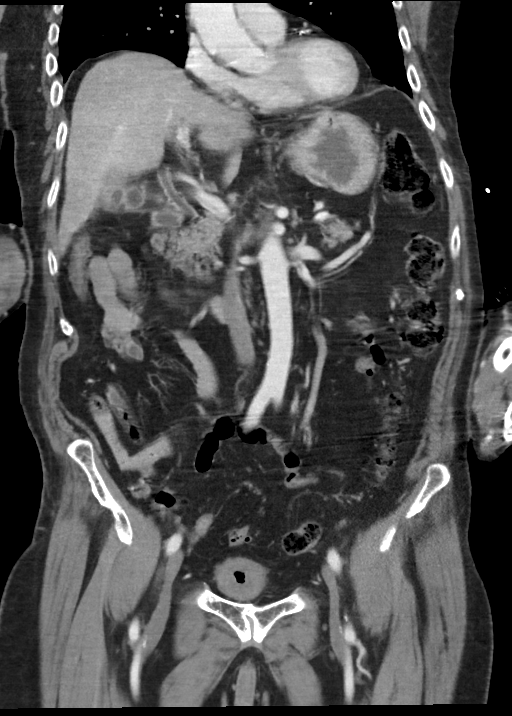
[im 48/86  soft-tissue]
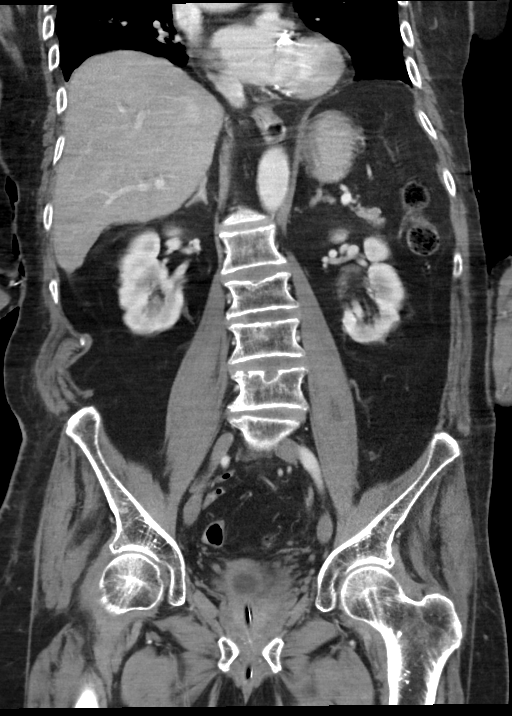

[15 of 46 positions shown; findings below may reference images not displayed]

FINDINGS: Lower chest: Bibasilar atelectasis is noted. Diffuse coronary artery
calcifications are seen. Mitral valve calcification is also noted.

Hepatobiliary: Small stones are seen within the gallbladder. Vague
soft tissue inflammation is noted about the relatively decompressed
gallbladder; acute cholecystitis is a concern. There is mild
prominence of the intrahepatic biliary ducts. The liver is otherwise
unremarkable in appearance.

The common bile duct remains normal in caliber.

Pancreas: The pancreas is within normal limits.

Spleen: The spleen is unremarkable in appearance.

Adrenals/Urinary Tract: The adrenal glands are unremarkable in
appearance. The kidneys are within normal limits. There is no
evidence of hydronephrosis. No renal or ureteral stones are
identified. No perinephric stranding is seen.

Stomach/Bowel: The stomach is unremarkable in appearance. The small
bowel is within normal limits. The appendix is normal in caliber,
without evidence of appendicitis. The colon is unremarkable in
appearance.

Vascular/Lymphatic: The abdominal aorta is unremarkable in
appearance. The inferior vena cava is grossly unremarkable. No
retroperitoneal lymphadenopathy is seen. No pelvic sidewall
lymphadenopathy is identified.

Reproductive: The bladder is decompressed, with a Foley catheter in
place. Apparent bilateral bladder wall thickening is nonspecific.
The prostate is borderline normal in size.

Other: No additional soft tissue abnormalities are seen.

Musculoskeletal: No acute osseous abnormalities are identified. The
visualized musculature is unremarkable in appearance.
IMPRESSION: 1. Vague soft tissue inflammation about the relatively decompressed
gallbladder. Acute cholecystitis is a concern. Would correlate for
associated symptoms. Mild prominence of the intrahepatic biliary
ducts.
2. Apparent bilateral bladder wall thickening is nonspecific.
Cystoscopy could be considered for further evaluation, if deemed
clinically appropriate.
3. Diffuse coronary artery calcifications. Mitral valve
calcification.
4. Bibasilar atelectasis.

## 2020-02-13 DEATH — deceased
